# Patient Record
Sex: Female | Born: 1960 | Race: White | Hispanic: No | Marital: Married | Smoking: Former smoker
Health system: Southern US, Community
[De-identification: ages and names within clinical notes are randomized; demographics above are authoritative.]

## PROBLEM LIST (undated history)

## (undated) DIAGNOSIS — M8580 Other specified disorders of bone density and structure, unspecified site: Secondary | ICD-10-CM

## (undated) DIAGNOSIS — F419 Anxiety disorder, unspecified: Secondary | ICD-10-CM

## (undated) DIAGNOSIS — H539 Unspecified visual disturbance: Secondary | ICD-10-CM

## (undated) HISTORY — DX: Unspecified visual disturbance: H53.9

## (undated) HISTORY — DX: Anxiety disorder, unspecified: F41.9

## (undated) HISTORY — DX: Other specified disorders of bone density and structure, unspecified site: M85.80

## (undated) HISTORY — PX: DILATION AND CURETTAGE OF UTERUS: SHX78

---

## 1998-03-13 ENCOUNTER — Emergency Department (HOSPITAL_COMMUNITY): Admission: EM | Admit: 1998-03-13 | Discharge: 1998-03-13 | Payer: Self-pay | Admitting: Emergency Medicine

## 2003-03-17 ENCOUNTER — Other Ambulatory Visit: Admission: RE | Admit: 2003-03-17 | Discharge: 2003-03-17 | Payer: Self-pay | Admitting: Obstetrics and Gynecology

## 2004-11-20 ENCOUNTER — Other Ambulatory Visit: Admission: RE | Admit: 2004-11-20 | Discharge: 2004-11-20 | Payer: Self-pay | Admitting: Obstetrics and Gynecology

## 2005-12-17 ENCOUNTER — Other Ambulatory Visit: Admission: RE | Admit: 2005-12-17 | Discharge: 2005-12-17 | Payer: Self-pay | Admitting: Obstetrics and Gynecology

## 2009-02-14 ENCOUNTER — Ambulatory Visit (HOSPITAL_BASED_OUTPATIENT_CLINIC_OR_DEPARTMENT_OTHER): Admission: RE | Admit: 2009-02-14 | Discharge: 2009-02-14 | Payer: Self-pay | Admitting: Internal Medicine

## 2009-02-14 ENCOUNTER — Ambulatory Visit: Payer: Self-pay | Admitting: Diagnostic Radiology

## 2009-06-22 ENCOUNTER — Encounter: Admission: RE | Admit: 2009-06-22 | Discharge: 2009-06-22 | Payer: Self-pay | Admitting: Obstetrics and Gynecology

## 2009-08-09 ENCOUNTER — Ambulatory Visit (HOSPITAL_BASED_OUTPATIENT_CLINIC_OR_DEPARTMENT_OTHER): Admission: RE | Admit: 2009-08-09 | Discharge: 2009-08-09 | Payer: Self-pay | Admitting: Obstetrics and Gynecology

## 2009-08-09 ENCOUNTER — Ambulatory Visit: Payer: Self-pay | Admitting: Diagnostic Radiology

## 2010-08-16 ENCOUNTER — Ambulatory Visit: Payer: Self-pay | Admitting: Diagnostic Radiology

## 2010-08-16 ENCOUNTER — Ambulatory Visit (HOSPITAL_BASED_OUTPATIENT_CLINIC_OR_DEPARTMENT_OTHER): Admission: RE | Admit: 2010-08-16 | Discharge: 2010-08-16 | Payer: Self-pay | Admitting: Obstetrics and Gynecology

## 2011-07-11 ENCOUNTER — Other Ambulatory Visit: Payer: Self-pay | Admitting: Obstetrics and Gynecology

## 2011-07-11 DIAGNOSIS — Z1231 Encounter for screening mammogram for malignant neoplasm of breast: Secondary | ICD-10-CM

## 2011-07-12 ENCOUNTER — Other Ambulatory Visit: Payer: Self-pay | Admitting: Obstetrics and Gynecology

## 2011-07-12 DIAGNOSIS — M899 Disorder of bone, unspecified: Secondary | ICD-10-CM

## 2011-08-20 ENCOUNTER — Ambulatory Visit
Admission: RE | Admit: 2011-08-20 | Discharge: 2011-08-20 | Disposition: A | Discharge status: Home / Self Care | Payer: BC Managed Care – PPO | Source: Ambulatory Visit | Attending: Obstetrics and Gynecology | Admitting: Obstetrics and Gynecology

## 2011-08-20 DIAGNOSIS — M899 Disorder of bone, unspecified: Secondary | ICD-10-CM

## 2011-08-20 DIAGNOSIS — Z1231 Encounter for screening mammogram for malignant neoplasm of breast: Secondary | ICD-10-CM

## 2011-08-20 DIAGNOSIS — M949 Disorder of cartilage, unspecified: Secondary | ICD-10-CM

## 2012-07-14 ENCOUNTER — Other Ambulatory Visit: Payer: Self-pay | Admitting: Obstetrics and Gynecology

## 2012-07-14 DIAGNOSIS — Z1231 Encounter for screening mammogram for malignant neoplasm of breast: Secondary | ICD-10-CM

## 2012-08-20 ENCOUNTER — Ambulatory Visit
Admission: RE | Admit: 2012-08-20 | Discharge: 2012-08-20 | Disposition: A | Discharge status: Home / Self Care | Payer: BC Managed Care – PPO | Source: Ambulatory Visit | Attending: Obstetrics and Gynecology | Admitting: Obstetrics and Gynecology

## 2012-08-20 DIAGNOSIS — Z1231 Encounter for screening mammogram for malignant neoplasm of breast: Secondary | ICD-10-CM

## 2013-07-27 ENCOUNTER — Other Ambulatory Visit (HOSPITAL_BASED_OUTPATIENT_CLINIC_OR_DEPARTMENT_OTHER): Payer: Self-pay | Admitting: Obstetrics and Gynecology

## 2013-07-27 DIAGNOSIS — Z1231 Encounter for screening mammogram for malignant neoplasm of breast: Secondary | ICD-10-CM

## 2013-09-02 ENCOUNTER — Ambulatory Visit (HOSPITAL_BASED_OUTPATIENT_CLINIC_OR_DEPARTMENT_OTHER)
Admission: RE | Admit: 2013-09-02 | Discharge: 2013-09-02 | Disposition: A | Discharge status: Home / Self Care | Payer: BC Managed Care – PPO | Source: Ambulatory Visit | Attending: Obstetrics and Gynecology | Admitting: Obstetrics and Gynecology

## 2013-09-02 DIAGNOSIS — Z1231 Encounter for screening mammogram for malignant neoplasm of breast: Secondary | ICD-10-CM | POA: Insufficient documentation

## 2014-05-05 DIAGNOSIS — R319 Hematuria, unspecified: Secondary | ICD-10-CM

## 2014-05-05 DIAGNOSIS — N951 Menopausal and female climacteric states: Secondary | ICD-10-CM | POA: Insufficient documentation

## 2014-05-05 DIAGNOSIS — F419 Anxiety disorder, unspecified: Secondary | ICD-10-CM | POA: Insufficient documentation

## 2014-05-05 DIAGNOSIS — N029 Recurrent and persistent hematuria with unspecified morphologic changes: Secondary | ICD-10-CM | POA: Insufficient documentation

## 2014-06-28 ENCOUNTER — Emergency Department (HOSPITAL_COMMUNITY)
Admission: EM | Admit: 2014-06-28 | Discharge: 2014-06-28 | Disposition: A | Discharge status: Home / Self Care | Payer: BC Managed Care – PPO | Attending: Emergency Medicine | Admitting: Emergency Medicine

## 2014-06-28 ENCOUNTER — Encounter (HOSPITAL_COMMUNITY): Payer: Self-pay | Admitting: Emergency Medicine

## 2014-06-28 ENCOUNTER — Emergency Department (HOSPITAL_COMMUNITY): Payer: BC Managed Care – PPO

## 2014-06-28 DIAGNOSIS — R209 Unspecified disturbances of skin sensation: Secondary | ICD-10-CM

## 2014-06-28 DIAGNOSIS — Z87891 Personal history of nicotine dependence: Secondary | ICD-10-CM | POA: Insufficient documentation

## 2014-06-28 DIAGNOSIS — Z791 Long term (current) use of non-steroidal anti-inflammatories (NSAID): Secondary | ICD-10-CM | POA: Diagnosis not present

## 2014-06-28 DIAGNOSIS — R202 Paresthesia of skin: Secondary | ICD-10-CM

## 2014-06-28 DIAGNOSIS — Z79899 Other long term (current) drug therapy: Secondary | ICD-10-CM | POA: Diagnosis not present

## 2014-06-28 LAB — URINE MICROSCOPIC-ADD ON

## 2014-06-28 LAB — URINALYSIS, ROUTINE W REFLEX MICROSCOPIC
Bilirubin Urine: NEGATIVE
Glucose, UA: NEGATIVE mg/dL
Ketones, ur: NEGATIVE mg/dL
NITRITE: NEGATIVE
PROTEIN: NEGATIVE mg/dL
Specific Gravity, Urine: 1.008 (ref 1.005–1.030)
UROBILINOGEN UA: 0.2 mg/dL (ref 0.0–1.0)
pH: 7.5 (ref 5.0–8.0)

## 2014-06-28 LAB — BASIC METABOLIC PANEL
ANION GAP: 12 (ref 5–15)
BUN: 16 mg/dL (ref 6–23)
CHLORIDE: 102 meq/L (ref 96–112)
CO2: 28 meq/L (ref 19–32)
CREATININE: 0.73 mg/dL (ref 0.50–1.10)
Calcium: 9.5 mg/dL (ref 8.4–10.5)
GFR calc Af Amer: >90 mL/min (ref >90)
GFR calc non Af Amer: >90 mL/min (ref >90)
Glucose, Bld: 103 mg/dL — ABNORMAL HIGH (ref 70–99)
POTASSIUM: 4.2 meq/L (ref 3.7–5.3)
SODIUM: 142 meq/L (ref 137–147)

## 2014-06-28 LAB — DIFFERENTIAL
Basophils Absolute: 0 10*3/uL (ref 0.0–0.1)
Basophils Relative: 0 % (ref 0–1)
EOS PCT: 1 % (ref 0–5)
Eosinophils Absolute: 0.1 10*3/uL (ref 0.0–0.7)
LYMPHS ABS: 1.7 10*3/uL (ref 0.7–4.0)
Lymphocytes Relative: 21 % (ref 12–46)
MONOS PCT: 7 % (ref 3–12)
Monocytes Absolute: 0.6 10*3/uL (ref 0.1–1.0)
Neutro Abs: 5.5 10*3/uL (ref 1.7–7.7)
Neutrophils Relative %: 71 % (ref 43–77)

## 2014-06-28 LAB — RAPID URINE DRUG SCREEN, HOSP PERFORMED
Amphetamines: NOT DETECTED
BARBITURATES: NOT DETECTED
Benzodiazepines: NOT DETECTED
Cocaine: NOT DETECTED
OPIATES: NOT DETECTED
TETRAHYDROCANNABINOL: NOT DETECTED

## 2014-06-28 LAB — I-STAT CHEM 8, ED
BUN: 16 mg/dL (ref 6–23)
Calcium, Ion: 1.15 mmol/L (ref 1.12–1.23)
Chloride: 103 mEq/L (ref 96–112)
Creatinine, Ser: 0.8 mg/dL (ref 0.50–1.10)
Glucose, Bld: 105 mg/dL — ABNORMAL HIGH (ref 70–99)
HEMATOCRIT: 41 % (ref 36.0–46.0)
Hemoglobin: 13.9 g/dL (ref 12.0–15.0)
Potassium: 4 mEq/L (ref 3.7–5.3)
SODIUM: 141 meq/L (ref 137–147)
TCO2: 28 mmol/L (ref 0–100)

## 2014-06-28 LAB — CBC
HEMATOCRIT: 37.5 % (ref 36.0–46.0)
HEMOGLOBIN: 12.9 g/dL (ref 12.0–15.0)
MCH: 30.2 pg (ref 26.0–34.0)
MCHC: 34.4 g/dL (ref 30.0–36.0)
MCV: 87.8 fL (ref 78.0–100.0)
Platelets: 227 10*3/uL (ref 150–400)
RBC: 4.27 MIL/uL (ref 3.87–5.11)
RDW: 12.9 % (ref 11.5–15.5)
WBC: 8.1 10*3/uL (ref 4.0–10.5)

## 2014-06-28 LAB — I-STAT TROPONIN, ED: TROPONIN I, POC: 0 ng/mL (ref 0.00–0.08)

## 2014-06-28 LAB — ETHANOL

## 2014-06-28 LAB — PROTIME-INR
INR: 0.97 (ref 0.00–1.49)
Prothrombin Time: 12.9 seconds (ref 11.6–15.2)

## 2014-06-28 LAB — APTT: APTT: 28 s (ref 24–37)

## 2014-06-28 NOTE — ED Notes (Signed)
MD Bednar at bedside 

## 2014-06-28 NOTE — Code Documentation (Signed)
53yo female arriving to [facility] at 1515 via [facility].  Patient reports that she had sudden onset left facial, arm and upper back tingling at 1400.  Symptoms lasted for about 30 minutes then resolved.  EMS activated by coworkers.  Code stroke called in the ED.  Patient taken to CT.  NIHSS 0, see documentation for details and code stroke times.  Dr. Thad Ranger to the bedside.  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  She said she felt as if her BP was elevated and as if there were "burning nerves" in her back.  She reports she has been dealing with elevated BP for about a month as her BP is normally low.  She also endorses left foot tingling which is common for her r/t back issues.  Patient's symptoms have resolved, TIA alert.  No acute stroke treatment at this time. Bedside handoff with ED RN Toniann Fail.

## 2014-06-28 NOTE — Discharge Instructions (Signed)
You may have had a transient ischemic attack (TIA). This means that the nervous system did not work properly for a short time. It is caused by a low oxygen supply to an area of your brain. It may be caused by a small blood clot or hardening of the arteries. This is a temporary neurologic condition. It usually gets better within thirty minutes, but always within twenty-four hours. If this does not resolve within that time period, it is defined as a stroke. TIA's are warning signs that you are at risk for having a stroke. A small percentage of patients who have had a TIA will have a stroke within a couple days, and up to 20% will have a stroke within 3 months. SEEK IMMEDIATE MEDICAL ATTENTION (Call 911) IF: The original symptoms that brought you in are getting worse, or if you develop any new change in speech, vision, swallowing, or understanding, incoordination, weakness, numbness, tingling, dizziness, fainting, severe headache, chest pain, or other concerns. Some patients who are having a stroke are eligible to receive a medication which may improve their outcome, but the drug usually must be given within three hours from when symptoms first occur. So if you think you might be having stroke symptoms, don't wait, call 911.  You have neck pain, possibly from a cervical strain and/or pinched nerve.  SEEK IMMEDIATE MEDICAL ATTENTION IF: You develop difficulties swallowing or breathing.  You have new or worse numbness, weakness, tingling, or movement problems in your arms or legs.  You develop increasing pain which is uncontrolled with medications.  You have change in bowel or bladder function, or other concerns.  SEEK IMMEDIATE MEDICAL ATTENTION IF: New numbness, tingling, weakness, or problem with the use of your arms or legs.  Severe back pain not relieved with medications.  Change in bowel or bladder control.  Increasing pain in any areas of the body (such as chest or abdominal pain).  Shortness of  breath, dizziness or fainting.  Nausea (feeling sick to your stomach), vomiting, fever, or sweats.

## 2014-06-28 NOTE — ED Notes (Signed)
Pt to ct 

## 2014-06-28 NOTE — ED Provider Notes (Signed)
CSN: [id number]     Arrival date & time 06/28/14  1515 History   First MD Initiated Contact with Patient 06/28/14 1533     Chief Complaint  Patient presents with  . Hypertension     (Consider location/radiation/quality/duration/timing/severity/associated sxs/prior Treatment) HPI 53 year old female states of the last several weeks she has had constant pain in her neck as well as low back with occasional tingling to her entire left arm as well as entire left leg but not both at the same time in those episodes of tingling tend to last somewhere greater than 10-15 minutes and somewhere less than 2 hours but the patient cannot recall for sure, she denies pain down her arm or pain down her left leg she also denies numbness or weakness. She states she has been undergoing physical therapy from orthopedics for her back and her neck. She states she was last known well at work today at 2:00 this afternoon when she had recurrence tingling to the left side of her face and left arm which has been persistent in the left arm but the left face tingling is now resolved, she however now has some tingling to her left leg, she states this tingling sensation is without weakness or incoordination and without numbness. She states this tingling sensation to her left arm and leg is similar to what she has had previously. She does not recall for sure if she had left face tingling previously. She states this morning in the late morning around 11:00 she did have a slight headache that was almost imperceptible not sudden severe and without any other associated symptoms except for a sensation was racing and she was feeling anxious and her blood pressure was elevated to the 140s over 90s which is high for her. History reviewed. No pertinent past medical history. History reviewed. No pertinent past surgical history. Family History  Problem Relation Age of Onset  . Hypertension Mother    History  Substance Use Topics  . Smoking  status: Former Smoker    Quit date: 10/02/1983  . Smokeless tobacco: Not on file  . Alcohol Use: No   OB History   Grav Para Term Preterm Abortions TAB SAB Ect Mult Living                 Review of Systems 10 Systems reviewed and are negative for acute change except as noted in the HPI.   Allergies  Review of patient's allergies indicates no known allergies.  Home Medications   Prior to Admission medications   Medication Sig Start Date End Date Taking? Authorizing Provider  Ascorbic Acid (VITAMIN C PO) Take 1 tablet by mouth daily.    Yes Historical Provider, MD  CALCIUM PO Take 1 tablet by mouth daily.    Yes Historical Provider, MD  Cholecalciferol (VITAMIN D PO) Take 1 tablet by mouth daily.    Yes Historical Provider, MD  naproxen sodium (ANAPROX) 220 MG tablet Take 220 mg by mouth daily as needed (for pain).   Yes Historical Provider, MD  LORazepam (ATIVAN) 0.5 MG tablet Take 0.5 mg by mouth 2 (two) times daily as needed for anxiety.    Historical Provider, MD  tiZANidine (ZANAFLEX) 4 MG tablet Take 4 mg by mouth daily as needed for muscle spasms.    Historical Provider, MD   BP 154/77  Pulse 100  Temp(Src) 97.4 F (36.3 C)  Resp 15  Ht  (1.651 m)  Wt 118 lb (53.524 kg)  BMI 19.64  kg/m2  SpO2 100%  LMP 07/14/2011 Physical Exam  Nursing note and vitals reviewed. Constitutional:  Awake, alert, nontoxic appearance with baseline speech for patient.  HENT:  Head: Atraumatic.  Mouth/Throat: No oropharyngeal exudate.  Eyes: EOM are normal. Pupils are equal, round, and reactive to light. Right eye exhibits no discharge. Left eye exhibits no discharge.  Neck: Neck supple.  Cardiovascular: Normal rate and regular rhythm.   No murmur heard. Pulmonary/Chest: Effort normal and breath sounds normal. No stridor. No respiratory distress. She has no wheezes. She has no rales. She exhibits no tenderness.  Abdominal: Soft. Bowel sounds are normal. She exhibits no mass. There  is no tenderness. There is no rebound.  Musculoskeletal: She exhibits no tenderness.  Baseline ROM, moves extremities with no obvious new focal weakness.  Lymphadenopathy:    She has no cervical adenopathy.  Neurological: She is alert.  Awake, alert, cooperative and aware of situation; motor strength 5/5 bilaterally; sensation normal to light touch bilaterally; peripheral visual fields full to confrontation; no facial asymmetry; tongue midline; major cranial nerves appear intact; no pronator drift, normal finger to nose bilaterally  Skin: No rash noted.  Psychiatric: She has a normal mood and affect.    ED Course  Procedures (including critical care time) Patient / Family / Caregiver understand and agree with initial ED impression and plan with expectations set for ED visit. Initial history and physical performed ~1545 prior to code stroke being activated; patient's symptoms are confusing and difficult to differentiate initially if this is a stroke mimic versus stroke symptoms. Pt seen by Neuro and OK for discharge per Neuro. Patient / Family / Caregiver informed of clinical course, understand medical decision-making process, and agree with plan.   Labs Review Labs Reviewed  BASIC METABOLIC PANEL - Abnormal; Notable for the following:    Glucose, Bld 103 (*)    All other components within normal limits  URINALYSIS, ROUTINE W REFLEX MICROSCOPIC - Abnormal; Notable for the following:    Hgb urine dipstick TRACE (*)    Leukocytes, UA SMALL (*)    All other components within normal limits  I-STAT CHEM 8, ED - Abnormal; Notable for the following:    Glucose, Bld 105 (*)    All other components within normal limits  CBC  ETHANOL  PROTIME-INR  APTT  DIFFERENTIAL  URINE RAPID DRUG SCREEN (HOSP PERFORMED)  URINE MICROSCOPIC-ADD ON  CBG MONITORING, ED  I-STAT TROPOININ, ED    Imaging Review Ct Head Wo Contrast  06/28/2014   CLINICAL DATA:  Headache with hypertension  EXAM: CT HEAD  WITHOUT CONTRAST  TECHNIQUE: Contiguous axial images were obtained from the base of the skull through the vertex without intravenous contrast.  COMPARISON:  None.  FINDINGS: The ventricles are normal in size and configuration. There is no mass, hemorrhage, extra-axial fluid collection, or midline shift. Gray-white compartments appear normal. No acute infarct apparent. Bony calvarium appears intact. The visualized mastoid air cells are clear.  IMPRESSION: Study within normal limits. No demonstrable mass, hemorrhage, or acute appearing infarct.   Electronically Signed   By: Bretta Bang M.D.   On: 06/28/2014 16:02   Mr Brain Wo Contrast  06/28/2014   CLINICAL DATA:  53 year old female with transient episode of the left face and arm tingling. Initial encounter.  EXAM: MRI HEAD WITHOUT CONTRAST  TECHNIQUE: Multiplanar, multiecho pulse sequences of the brain and surrounding structures were obtained without intravenous contrast.  COMPARISON:  06/18/2014 head CT.  FINDINGS: No acute infarct.  No intracranial hemorrhage.  No intracranial mass lesion noted on this unenhanced exam.  No hydrocephalus  Scattered punctate nonspecific white matter type changes most notable frontal lobes. This may be related to result of small vessel disease or migraine headaches. The appearance is not typical for demyelinating process. Other causes of white matter type changes such as that secondary to vasculitis, inflammatory process or prior trauma not entirely excluded although less likely considerations.  Cerebellar tonsils minimally low lying but within the range of normal limits.  Major intracranial vascular structures are patent. Mildly ectatic basilar artery.  Pituitary region unremarkable. Partially calcified pineal gland incidentally noted.  Orbital structures unremarkable.  IMPRESSION: No acute infarct.  Mild nonspecific white matter changes as noted above.   Electronically Signed   By: Bridgett Larsson M.D.   On: 06/28/2014 19:10      EKG Interpretation   Date/Time:  Monday June 28 2014 15:33:33 EDT Ventricular Rate:  96 PR Interval:  140 QRS Duration: 96 QT Interval:  353 QTC Calculation: 446 R Axis:   85 Text Interpretation:  Sinus rhythm Probable left ventricular hypertrophy  No previous ECGs available Confirmed by [facility]  MD, Jonny Ruiz ([id number]) on  06/28/2014 5:34:48 PM      MDM   Final diagnoses:  Paresthesias    I doubt any other EMC precluding discharge at this time including, but not necessarily limited to the following:SAH, HTN crisis, CVA.    Hurman Horn, MD 06/30/14 [phone]

## 2014-06-28 NOTE — ED Notes (Signed)
Pt in from work, per report pt c/o elevated BP onset this am, pt reports being at work & c/o tingling in L face, L neck & L arm today 14:00, pt denies slurred speech, A&O x 4, follows commands, speaks in complete, denies taking BP, denies CP, SOB, n/v/d

## 2014-06-28 NOTE — Consult Note (Signed)
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  She was with a patient today when she noticed a new onset of left face and arm tingling.  Coworkers took her BP and she states it "was elevated at work". This lasted less than one hour.  Due to fear this may be a CVA she was brought to [facility] ED. Currently she has no left arm or face tingling simply feel anxious.  Reports the numbness was on the left cheek and in the entire left upper extremity.  In the leg the numbness was mostly in the foot.  Patient also adds that numbness was in the posterior portion of her shoulder but not the anterior portion.    Date last known well: Date: 06/28/2014 Time last known well: Time: 14:00 tPA Given: No: symptoms resolved  No pertinent past medical history.  No pertinent past surgical history.  Family History  Problem Relation Age of Onset  . Hypertension Mother    Social History:  reports that she quit smoking about 30 years ago. She does not have any smokeless tobacco history on file. She reports that she does not drink alcohol or use illicit drugs.  Allergies: No Known Allergies  Medications:                                                                                                                           NONE  ROS:  History obtained from the patient  General ROS: negative for - chills, fatigue, fever, night sweats, weight gain or weight loss Psychological ROS: negative for - behavioral disorder, hallucinations, memory difficulties, mood swings or suicidal ideation Ophthalmic ROS: negative for - blurry vision, double vision, eye pain or loss of  vision ENT ROS: negative for - epistaxis, nasal discharge, oral lesions, sore throat, tinnitus or vertigo Allergy and Immunology ROS: negative for - hives or itchy/watery eyes Hematological and Lymphatic ROS: negative for - bleeding problems, bruising or swollen lymph nodes Endocrine ROS: negative for - galactorrhea, hair pattern changes, polydipsia/polyuria or temperature intolerance Respiratory ROS: negative for - cough, hemoptysis, shortness of breath or wheezing Cardiovascular ROS: negative for - chest pain, dyspnea on exertion, edema or irregular heartbeat Gastrointestinal ROS: negative for - abdominal pain, diarrhea, hematemesis, nausea/vomiting or stool incontinence Genito-Urinary ROS: negative for - dysuria, hematuria, incontinence or urinary frequency/urgency Musculoskeletal ROS: negative for - joint swelling or muscular weakness Neurological ROS: as noted in HPI Dermatological ROS: negative for rash and skin lesion changes  Neurologic Examination:                                                                                                      Blood pressure 165/80, pulse 104, resp. rate 20, last menstrual period 07/14/2011, SpO2 100.00%.   General: Anxious Mental Status: Alert, oriented, thought content appropriate.  Speech fluent without evidence of aphasia.  Able to follow 3 step commands without difficulty. Cranial Nerves: II: Discs flat bilaterally; Visual fields grossly normal, pupils equal, round, reactive to light and accommodation III,IV, VI: ptosis not present, extra-ocular motions intact bilaterally V,VII: smile symmetric, facial light touch sensation normal bilaterally VIII: hearing normal bilaterally IX,X: gag reflex present XI: bilateral shoulder shrug XII: midline tongue extension without atrophy or fasciculations  Motor: Right : Upper extremity   5/5    Left:     Upper extremity   5/5  Lower extremity   5/5     Lower extremity   5/5 Tone and bulk:normal  tone throughout; no atrophy noted Sensory: Pinprick and light touch intact throughout, bilaterally Deep Tendon Reflexes:  Right: Upper Extremity   Left: Upper extremity   biceps (C-5 to C-6) 2/4   biceps (C-5 to C-6) 2/4 tricep (C7) 2/4    triceps (C7) 2/4 Brachioradialis (C6) 2/4  Brachioradialis (C6) 2/4  Lower Extremity Lower Extremity  quadriceps (L-2 to L-4) 2/4   quadriceps (L-2 to L-4) 2/4 Achilles (S1) 2/4   Achilles (S1) 2/4  Plantars: Right: downgoing   Left: downgoing Cerebellar: normal finger-to-nose,  normal heel-to-shin test Gait: not tested CV: pulses palpable throughout    Lab Results: Basic Metabolic Panel:  Recent Labs Lab 06/28/14 1618  NA 141  K 4.0  CL 103  GLUCOSE 105*  BUN 16  CREATININE 0.80    Liver Function Tests: No results found for this basename: AST, ALT, ALKPHOS, BILITOT, PROT, ALBUMIN,  in the last 168 hours No results found for this basename: LIPASE, AMYLASE,  in the last 168 hours No results found for this  basename: AMMONIA,  in the last 168 hours  CBC:  Recent Labs Lab 06/28/14 1607 06/28/14 1618  WBC 8.1  --   HGB 12.9 13.9  HCT 37.5 41.0  MCV 87.8  --   PLT 227  --     Cardiac Enzymes: No results found for this basename: CKTOTAL, CKMB, CKMBINDEX, TROPONINI,  in the last 168 hours  Lipid Panel: No results found for this basename: CHOL, TRIG, HDL, CHOLHDL, VLDL, LDLCALC,  in the last 168 hours  CBG: No results found for this basename: GLUCAP,  in the last 168 hours  Microbiology: No results found for this or any previous visit.  Coagulation Studies: No results found for this basename: LABPROT, INR,  in the last 72 hours  Imaging: Ct Head Wo Contrast  06/28/2014   CLINICAL DATA:  Headache with hypertension  EXAM: CT HEAD WITHOUT CONTRAST  TECHNIQUE: Contiguous axial images were obtained from the base of the skull through the vertex without intravenous contrast.  COMPARISON:  None.  FINDINGS: The ventricles are  normal in size and configuration. There is no mass, hemorrhage, extra-axial fluid collection, or midline shift. Gray-white compartments appear normal. No acute infarct apparent. Bony calvarium appears intact. The visualized mastoid air cells are clear.  IMPRESSION: Study within normal limits. No demonstrable mass, hemorrhage, or acute appearing infarct.   Electronically Signed   By: Bretta Bang M.D.   On: 06/28/2014 16:02    Felicie Morn PA-C [facility] [phone]  06/28/2014, 4:23 PM  Patient seen and examined.  Clinical course and management discussed.  Necessary edits performed.  I agree with the above.  Assessment and plan of care developed and discussed below.    Assessment: 53 y.o. female with transient left arm and face tingling.  Patient also with complaints of neck and back pain.  Seeing an orthopedist and recieving outpatient PT.   Currently back to baseline. Patient has no stroke risk factors and NIHSS 0. Head CT reviewed and shows no acute changes.    Stroke Risk Factors - none  Recommendations: 1.  MRI of the brain without contrast.  Would not initiate a stroke work up unless clinical changes noted or imaging diagnostic of an acute ischemic event.   2.  ASA  daily 3.  Patient to follow up with outpatient orthopedist and outpatient PCP  Case discussed with Dr. Jennet Maduro, MD [facility] [phone]  06/28/2014  5:27 PM

## 2014-06-28 NOTE — ED Notes (Signed)
Pt not in room for Neuro check; pt in MRI

## 2014-06-30 DIAGNOSIS — M542 Cervicalgia: Secondary | ICD-10-CM | POA: Insufficient documentation

## 2014-06-30 DIAGNOSIS — I10 Essential (primary) hypertension: Secondary | ICD-10-CM | POA: Insufficient documentation

## 2014-07-05 DIAGNOSIS — F4001 Agoraphobia with panic disorder: Secondary | ICD-10-CM | POA: Insufficient documentation

## 2014-10-12 ENCOUNTER — Other Ambulatory Visit (HOSPITAL_BASED_OUTPATIENT_CLINIC_OR_DEPARTMENT_OTHER): Payer: Self-pay | Admitting: Obstetrics and Gynecology

## 2014-10-12 DIAGNOSIS — Z1231 Encounter for screening mammogram for malignant neoplasm of breast: Secondary | ICD-10-CM

## 2014-10-26 ENCOUNTER — Ambulatory Visit (HOSPITAL_BASED_OUTPATIENT_CLINIC_OR_DEPARTMENT_OTHER)
Admission: RE | Admit: 2014-10-26 | Discharge: 2014-10-26 | Disposition: A | Discharge status: Home / Self Care | Payer: BLUE CROSS/BLUE SHIELD | Source: Ambulatory Visit | Attending: Obstetrics and Gynecology | Admitting: Obstetrics and Gynecology

## 2014-10-26 DIAGNOSIS — Z1231 Encounter for screening mammogram for malignant neoplasm of breast: Secondary | ICD-10-CM | POA: Insufficient documentation

## 2014-10-27 ENCOUNTER — Other Ambulatory Visit: Payer: Self-pay | Admitting: Obstetrics and Gynecology

## 2015-02-04 ENCOUNTER — Other Ambulatory Visit (HOSPITAL_COMMUNITY): Payer: Self-pay | Admitting: Obstetrics and Gynecology

## 2015-02-07 LAB — CYTOLOGY - PAP

## 2015-02-09 ENCOUNTER — Encounter: Payer: Self-pay | Admitting: Neurology

## 2015-02-09 ENCOUNTER — Ambulatory Visit (INDEPENDENT_AMBULATORY_CARE_PROVIDER_SITE_OTHER): Payer: BLUE CROSS/BLUE SHIELD | Admitting: Neurology

## 2015-02-09 VITALS — BP 146/90 | HR 82 | Resp 16 | Ht 65.0 in | Wt 119.0 lb

## 2015-02-09 DIAGNOSIS — G5762 Lesion of plantar nerve, left lower limb: Secondary | ICD-10-CM | POA: Diagnosis not present

## 2015-02-09 DIAGNOSIS — M5432 Sciatica, left side: Secondary | ICD-10-CM

## 2015-02-09 DIAGNOSIS — R209 Unspecified disturbances of skin sensation: Secondary | ICD-10-CM | POA: Diagnosis not present

## 2015-02-09 DIAGNOSIS — G5760 Lesion of plantar nerve, unspecified lower limb: Secondary | ICD-10-CM | POA: Insufficient documentation

## 2015-02-09 DIAGNOSIS — F419 Anxiety disorder, unspecified: Secondary | ICD-10-CM | POA: Diagnosis not present

## 2015-02-09 NOTE — Patient Instructions (Signed)
I advised her to try alpha lipoic acid 200 mg 2 or 3 times a day. If pain worsens, I would consider adding gabapentin or lamotrigine or a tricyclic If mood issues worsen, consider Cymbalta as it may also help her nerve pain.

## 2015-02-09 NOTE — Progress Notes (Signed)
[facility]  PATIENT: Mariah [name] DOB: [birth year]  REFERRING DOCTOR OR PCP:  Idamae Schuller (PCP) and Suella Broad  (Ortho/pain) SOURCE: patient and records from EMR, images on CD  _________________________________   HISTORICAL  CHIEF COMPLAINT:  Chief Complaint  Patient presents with  . Numbness    Sts. onset around October 2015 of numbness 1st and 2nd toes of her left foot, left heel.  She notices numbness more when she is wearing flat shoes.  Sts. she also has a hx. of numbness in her left arm, but that she had a "mental breakdown" and that when she was rx'd Lorazepam and Zoloft, that numbnes in arm resolved, but numbness in left foot remained.  Sts. mri of neck and back were normal, and Dr. Nelva Bush was not able to give a clear dx./fim    HISTORY OF PRESENT ILLNESS:  I had the pleasure of seeing your patient, Mariah [name], at [facility] for neurologic consultation regarding her left foot numbness.    She first noted numbness in the left foot around October 2015. The distribution of the numbness is mostly in the insole and involving the first and second toe but not further laterally. Numbness is only in the bottom and side of the toes and foot and not on the dorsum.    Initially, she also had neck and back pain and milder paresthesias in her left arm and rest of leg (intermittently).   MRI images were personally reviewed. MRI's of the lumbar spine 07/26/14 shows left facet hypertrophy but no nerve root compression.   MRI of the cervical spine just shows small bulges at C5-C6 and C6-C7 that did not lead to any nerve root or spinal cord impingement.   Shortly after those studies, she had a lot more anxiety and was placed on Zoloft.    The left arm and proximal leg paresthesia all improved but the foot numbness persisted.     She never had any weakness in any of the limbs nor any change in her bladder or change in her gait.  In February, she  simplified her medications by stopping the Zoloft and also stopping the hormone replacement. Currently, she is only on when necessary Naprosyn and takes only a couple a week, usually when she notes she is going to be doing more activity such as playing tennis.  She also reports left buttock pain at times. This is located just below the sacral and will sometimes radiate a few inches down, but not all the way to the foot. Naprosyn helps this pain some.  REVIEW OF SYSTEMS: Constitutional: No fevers, chills, sweats, or change in appetite Eyes: No visual changes, double vision, eye pain Ear, nose and throat: No hearing loss, ear pain, nasal congestion, sore throat Cardiovascular: No chest pain, palpitations Respiratory: No shortness of breath at rest or with exertion.   No wheezes GastrointestinaI: No nausea, vomiting, diarrhea, abdominal pain, fecal incontinence Genitourinary: No dysuria, urinary retention or frequency.  No nocturia. Musculoskeletal: No neck pain, back pain Integumentary: No rash, pruritus, skin lesions Neurological: as above Psychiatric: No depression at this time.  She has anxiety Endocrine: No palpitations, diaphoresis, change in appetite, change in weigh or increased thirst Hematologic/Lymphatic: No anemia, purpura, petechiae. Allergic/Immunologic: No itchy/runny eyes, nasal congestion, recent allergic reactions, rashes  ALLERGIES: No Known Allergies  HOME MEDICATIONS:  Current outpatient prescriptions:  .  naproxen sodium (ANAPROX) 220 MG tablet, Take 220 mg by mouth 2 (two) times daily with a meal.,  Disp: , Rfl:   PAST MEDICAL HISTORY: Past Medical History  Diagnosis Date  . Vision abnormalities     PAST SURGICAL HISTORY: History reviewed. No pertinent past surgical history.  FAMILY HISTORY: Family History  Problem Relation Age of Onset  . Hypertension Mother   . Melanoma Father   . Multiple myeloma Father     SOCIAL HISTORY:  History   Social  History  . Marital Status: Married    Spouse Name: N/A  . Number of Children: N/A  . Years of Education: N/A   Occupational History  . Not on file.   Social History Main Topics  . Smoking status: Former Smoker    Quit date: 10/02/1983  . Smokeless tobacco: Not on file  . Alcohol Use: No  . Drug Use: No  . Sexual Activity: Not on file   Other Topics Concern  . Not on file   Social History Narrative     PHYSICAL EXAM  Filed Vitals:   02/09/15 1359  BP: 146/90  Pulse: 82  Resp: 16  Height: '5\' 5"'  (1.651 m)  Weight: 119 lb (53.978 kg)    Body mass index is 19.8 kg/(m^2).   General: The patient is well-developed and well-nourished and in no acute distress  Neck: The neck is supple, no carotid bruits are noted.  The neck is nontender.  Cardiovascular: The heart has a regular rate and rhythm with a normal S1 and S2. There were no murmurs, gallops or rubs.   Skin: Extremities are without significant edema or rash.   Joints are not inflamed.   Musculoskeletal:  Back is minimally tender over the left piriformis muscle.  Neurologic Exam  Mental status: The patient is alert and oriented x 3 at the time of the examination. The patient has apparent normal recent and remote memory, with an apparently normal attention span and concentration ability.   Speech is normal.  Cranial nerves: Extraocular movements are full.   Facial symmetry is present. There is good facial sensation to soft touch bilaterally.Facial strength is normal.  Trapezius and sternocleidomastoid strength is normal. No dysarthria is noted.  The tongue is midline, and the patient has symmetric elevation of the soft palate. No obvious hearing deficits are noted.  Motor:  Muscle bulk is normal.   Tone is normal. Strength is  5 / 5 in all 4 extremities.   Sensory: Sensory testing is intact to pinprick, soft touch and vibration sensation in all 4 extremities.  Coordination: Cerebellar testing reveals good  finger-nose-finger and heel-to-shin bilaterally.  Gait and station: Station is normal.   Gait is normal. Tandem gait is normal. Romberg is negative.   Reflexes: Deep tendon reflexes are symmetric and normal bilaterally.   Plantar responses are flexor.    DIAGNOSTIC DATA (LABS, IMAGING, TESTING) - I reviewed patient records, labs, notes, testing and imaging myself where available.  Lab Results  Component Value Date   WBC 8.1 06/28/2014   HGB 13.9 06/28/2014   HCT 41.0 06/28/2014   MCV 87.8 06/28/2014   PLT 227 06/28/2014      Component Value Date/Time   NA 141 06/28/2014 1618   K 4.0 06/28/2014 1618   CL 103 06/28/2014 1618   CO2 28 06/28/2014 1607   GLUCOSE 105* 06/28/2014 1618   BUN 16 06/28/2014 1618   CREATININE 0.80 06/28/2014 1618   CALCIUM 9.5 06/28/2014 1607   GFRNONAA >90 06/28/2014 1607   GFRAA >90 06/28/2014 1607       ASSESSMENT  AND PLAN  Disturbance of skin sensation - Plan: Sedimentation rate, ANA w/Reflex  Anxiety  Medial plantar neuropathy, left - Plan: Sedimentation rate, ANA w/Reflex  Sciatica of left side without back pain   In summary, Kaia Depaolis is a 54 year old woman with a sensation of numbness and painful tingling in the left foot in what appears to be mostly the medial plantar nerve distribution in all fleeting tingling that she gets appears to be more related to her anxiety than to an actual neuropathic process. She also appears to have some tenderness in the left piriformis muscle that might be causing a mild sciatica at times.    I will check an ESR and ANA to check for abnormalities that could be seen with vasculitis, but I think that the medial plantar neuropathy is probably idiopathic.    We discussed that I could start a medicine to help her tingling sensations but we agreed that the current symptoms do not appear to be bad enough to risk any side effects. Therefore she will hold off at this time. If the tingling pain worsened  significantly, I would consider gabapentin, lamotrigine or a tricyclic. Current anxiety seems to be doing well. However, if anxiety and/or depression worsens, Cymbalta might help her mood issues as well as the tingling sensations. Her piriformis/sciatica type pain is minimal today. If it stays intermittent, when necessary Naprosyn is probably the most appropriate. I did also instruct her on piriformis stretch exercises. If this worsens, she can call and I would consider an injection and physical therapy.  She will return as needed if she has new or worsening neurologic symptoms.   Richard A. Felecia Shelling, MD, PhD 2/39/3594, 0:90 PM Certified in Neurology, Clinical Neurophysiology, Sleep Medicine, Pain Medicine and Neuroimaging  [facility] [address], [place], [place] [address] [phone]

## 2015-02-10 LAB — ANA W/REFLEX: Anti Nuclear Antibody(ANA): NEGATIVE

## 2015-02-10 LAB — SEDIMENTATION RATE: Sed Rate: 2 mm/hr (ref 0–40)

## 2015-02-15 ENCOUNTER — Telehealth: Payer: Self-pay

## 2015-02-15 ENCOUNTER — Telehealth: Payer: Self-pay | Admitting: *Deleted

## 2015-02-15 NOTE — Telephone Encounter (Signed)
VM left for patient to call office back to receive normal lab results 

## 2015-02-15 NOTE — Telephone Encounter (Signed)
I have spoken with Marylu LundJanet this afternoon and advised that, per RAS, labs were normal.  She verbalized understanding of same, sts. I am the 2nd nurse to call her with normal lab results/fim

## 2015-02-15 NOTE — Telephone Encounter (Signed)
Patient called/returning Joy's call regarding her results. Please call and advise. Patient can be reached @ [phone]

## 2015-02-15 NOTE — Telephone Encounter (Signed)
-----   Message from Asa Lenteichard A Sater, MD sent at 02/14/2015  6:46 PM EDT ----- Please let her know labwork was normal

## 2015-02-15 NOTE — Telephone Encounter (Signed)
Returned pt call regarding normal lab results, she has no questions at this time

## 2015-10-11 ENCOUNTER — Other Ambulatory Visit (HOSPITAL_BASED_OUTPATIENT_CLINIC_OR_DEPARTMENT_OTHER): Payer: Self-pay | Admitting: Obstetrics and Gynecology

## 2015-10-11 DIAGNOSIS — Z1231 Encounter for screening mammogram for malignant neoplasm of breast: Secondary | ICD-10-CM

## 2015-11-14 ENCOUNTER — Ambulatory Visit (HOSPITAL_BASED_OUTPATIENT_CLINIC_OR_DEPARTMENT_OTHER)
Admission: RE | Admit: 2015-11-14 | Discharge: 2015-11-14 | Disposition: A | Discharge status: Home / Self Care | Payer: BLUE CROSS/BLUE SHIELD | Source: Ambulatory Visit | Attending: Obstetrics and Gynecology | Admitting: Obstetrics and Gynecology

## 2015-11-14 ENCOUNTER — Ambulatory Visit (HOSPITAL_BASED_OUTPATIENT_CLINIC_OR_DEPARTMENT_OTHER): Payer: BLUE CROSS/BLUE SHIELD

## 2015-11-14 DIAGNOSIS — Z1231 Encounter for screening mammogram for malignant neoplasm of breast: Secondary | ICD-10-CM | POA: Diagnosis present

## 2016-11-28 ENCOUNTER — Other Ambulatory Visit (HOSPITAL_BASED_OUTPATIENT_CLINIC_OR_DEPARTMENT_OTHER): Payer: Self-pay | Admitting: Obstetrics and Gynecology

## 2016-11-28 DIAGNOSIS — Z1231 Encounter for screening mammogram for malignant neoplasm of breast: Secondary | ICD-10-CM

## 2016-12-10 ENCOUNTER — Encounter (HOSPITAL_BASED_OUTPATIENT_CLINIC_OR_DEPARTMENT_OTHER): Payer: Self-pay

## 2016-12-10 ENCOUNTER — Ambulatory Visit (HOSPITAL_BASED_OUTPATIENT_CLINIC_OR_DEPARTMENT_OTHER)
Admission: RE | Admit: 2016-12-10 | Discharge: 2016-12-10 | Disposition: A | Discharge status: Home / Self Care | Payer: BLUE CROSS/BLUE SHIELD | Source: Ambulatory Visit | Attending: Obstetrics and Gynecology | Admitting: Obstetrics and Gynecology

## 2016-12-10 DIAGNOSIS — Z1231 Encounter for screening mammogram for malignant neoplasm of breast: Secondary | ICD-10-CM | POA: Insufficient documentation

## 2017-11-06 ENCOUNTER — Other Ambulatory Visit (HOSPITAL_BASED_OUTPATIENT_CLINIC_OR_DEPARTMENT_OTHER): Payer: Self-pay | Admitting: Obstetrics and Gynecology

## 2017-11-06 DIAGNOSIS — Z1231 Encounter for screening mammogram for malignant neoplasm of breast: Secondary | ICD-10-CM

## 2017-12-16 ENCOUNTER — Ambulatory Visit (HOSPITAL_BASED_OUTPATIENT_CLINIC_OR_DEPARTMENT_OTHER)
Admission: RE | Admit: 2017-12-16 | Discharge: 2017-12-16 | Disposition: A | Discharge status: Home / Self Care | Payer: BLUE CROSS/BLUE SHIELD | Source: Ambulatory Visit | Attending: Obstetrics and Gynecology | Admitting: Obstetrics and Gynecology

## 2017-12-16 DIAGNOSIS — Z1231 Encounter for screening mammogram for malignant neoplasm of breast: Secondary | ICD-10-CM | POA: Insufficient documentation

## 2018-07-14 ENCOUNTER — Ambulatory Visit: Payer: BLUE CROSS/BLUE SHIELD | Admitting: Psychiatry

## 2018-07-14 DIAGNOSIS — F411 Generalized anxiety disorder: Secondary | ICD-10-CM | POA: Diagnosis not present

## 2018-07-14 NOTE — Progress Notes (Signed)
      [facility] Counselor/Therapist Progress Note   Patient ID: Mariah [name], MRN: [id number]  Date: 07/14/2018  Timespent: 50 minutes  Treatment Type: Individual  Subjective: Patient was present for session.  She reported she was not feeling well during session.  She explained she had just gotten back a few days ago from her trip to [place].  Patient stated that overall intake down well, she was able to let go of the things, now back home while she was everything there which helped her rest better and be more relaxed.  Patient shared prior to leaving she had to deal with her mother-in-law being in the hospital due to low sodium.  Patient stated that the mother-in-law takes too many laxatives and that is probably why her sodium gets low.  She went on to share that since returning her sleep has been off and she has been back to the stress of having to help her daughter deal with stressors in her life.  Patient shared she is ready to deal with the issue between her daughter and son because it is time to get that had all of them.  Patient did treatment plan in session and agreed that her biggest concern now is to keep being able to function with a variety of different life stressors.  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  Patient was encouraged to get back to doing the things that were helping her prior to leaving including her positive affirmations as well as her spiritual affirmations.  Patient was reminded to CBT skills that help and the importance of reminding herself she can only deal with things that she can control things and change.  Interventions:CBT, Solution Focused and Supportive  Mental Status Exam:   Appearance:   Neat     Behavior:  Appropriate  Motor:  Normal  Speech/Language:   Normal Rate  Affect:  Appropriate  Mood:  normal  Thought  process:  Intact  Thought content:    Logical  Perceptual disturbances:    Normal  Orientation:  Full (Time, Place, and Person)  Attention:  Good  Concentration:  good  Memory:  Immediate  Fund of knowledge:   Good  Insight:    Good  Judgment:   Good  Impulse Control:  good    Reported Symptoms: anxiety,fatigue, sleep issues  Risk Assessment: Danger to Self:  No Self-injurious Behavior: No Danger to Others: No Duty to Warn:no Physical Aggression / Violence:No  Access to Firearms a concern: No  Gang Involvement:No   Diagnosis:   ICD-10-CM   1. Generalized anxiety disorder F41.1      Plan: 1.  Patient to continue to engage in individual counseling 2-4 times a month or as needed. 2.  Patient to identify and apply CBT, coping skills learned in session to decrease anxiety symptoms. 3.  Patient to contact this office, go to the local ED or call 911 if a crisis or emergency develops between visits.  [name], [place]

## 2018-07-28 ENCOUNTER — Ambulatory Visit: Payer: BLUE CROSS/BLUE SHIELD | Admitting: Psychiatry

## 2018-07-28 DIAGNOSIS — F411 Generalized anxiety disorder: Secondary | ICD-10-CM

## 2018-07-28 NOTE — Progress Notes (Signed)
      [facility] Counselor/Therapist Progress Note   Patient ID: Mariah [name], MRN: [id number]  Date: 07/28/2018  Timespent: 49 minutes   Treatment Type: Individual   Reported Symptoms: Sleep disturbance, Physical aches and pain and anxiety   Mental Status Exam:    Appearance:   Neat     Behavior:  Appropriate  Motor:  Normal  Speech/Language:   Normal Rate  Affect:  Appropriate  Mood:  anxious  Thought process:  normal  Thought content:    WNL  Sensory/Perceptual disturbances:    WNL  Orientation:  oriented to person, place and time/date  Attention:  Good  Concentration:  Good  Memory:  WNL  Fund of knowledge:   Good  Insight:    Good  Judgment:   Good  Impulse Control:  Good     Risk Assessment: Danger to Self:  No Self-injurious Behavior: No Danger to Others: No Duty to Warn:no Physical Aggression / Violence:No  Access to Firearms a concern: No  Gang Involvement:No    Subjective: Patient was present for session.  Patient reported she wanted to be very focused on the meeting she had to have with her son and daughter over the Thanksgiving.  Patient and clinician discussed what she wanted to have occurred during the meeting and different ways that she can communicate with her kids to help allow them both to communicate and resolve some issues from the past.  Patient explained she is working hard to try and forgive her husband for modeling the lack of communication and ways to disconnect from situations for her son.  Patient explained she is trying to recognize that he had the same things model for him and that she just has to keep encouraging him to make different choices.  Patient also discussed her small group and how what she is learning in group may be helpful but she does not feel comfortable sharing what is going on with her currently.  Discussed how she wanted to handle the situation.  Patient decided at this point it would be best that she went to one more  meeting and then pulled out of the group at this time.  Patient was encouraged to do what she felt was best for her in the situation.   Interventions: Solution-Oriented/Positive Psychology   Diagnosis:   ICD-10-CM   1. Generalized anxiety disorder F41.1      Plan: 1.  Patient to continue to engage in individual counseling 2-4 times a month or as needed. 2.  Patient to identify and apply coping skills learned in session to decrease anxiety symptoms. 3.  Patient to contact this office, go to the local ED or call 911 if a crisis or emergency develops between visits.   [name], [place]

## 2018-08-19 ENCOUNTER — Encounter: Payer: Self-pay | Admitting: Emergency Medicine

## 2018-08-19 ENCOUNTER — Ambulatory Visit: Payer: BLUE CROSS/BLUE SHIELD | Admitting: Psychiatry

## 2018-08-19 DIAGNOSIS — G4700 Insomnia, unspecified: Secondary | ICD-10-CM | POA: Insufficient documentation

## 2018-08-19 DIAGNOSIS — F411 Generalized anxiety disorder: Secondary | ICD-10-CM | POA: Diagnosis not present

## 2018-08-19 DIAGNOSIS — F5104 Psychophysiologic insomnia: Secondary | ICD-10-CM | POA: Insufficient documentation
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
## 2018-09-03 ENCOUNTER — Encounter: Payer: Self-pay | Admitting: Psychiatry

## 2018-09-03 ENCOUNTER — Ambulatory Visit: Payer: BLUE CROSS/BLUE SHIELD | Admitting: Psychiatry

## 2018-09-03 DIAGNOSIS — F411 Generalized anxiety disorder: Secondary | ICD-10-CM
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
## 2018-09-15 ENCOUNTER — Ambulatory Visit: Payer: BLUE CROSS/BLUE SHIELD | Admitting: Psychiatry

## 2018-09-15 ENCOUNTER — Encounter: Payer: Self-pay | Admitting: Psychiatry

## 2018-09-15 DIAGNOSIS — F411 Generalized anxiety disorder: Secondary | ICD-10-CM

## 2018-09-15 NOTE — Progress Notes (Signed)
      [facility] Counselor/Therapist Progress Note  Patient ID: Mariah DoeJanet Barkalow, MRN: [id number],    Date: 09/15/2018  Time Spent: 48 minutes  Treatment Type: Individual Therapy,sleep issues  Reported Symptoms: Anxious Mood  Mental Status Exam:  Appearance:   Well Groomed     Behavior:  Appropriate  Motor:  Normal  Speech/Language:   Normal Rate  Affect:  Congruent  Mood:  anxious  Thought process:  normal  Thought content:    WNL  Sensory/Perceptual disturbances:    WNL  Orientation:  oriented to person, place and time/date  Attention:  Good  Concentration:  Good  Memory:  WNL  Fund of knowledge:   Good  Insight:    Good  Judgment:   Good  Impulse Control:  Good   Risk Assessment: Danger to Self:  No Self-injurious Behavior: No Danger to Others: No Duty to Warn:no Physical Aggression / Violence:No  Access to Firearms a concern: No  Gang Involvement:No   Subjective: Patient was present for session.  Patient reported her conference went very well and she was able to have some very positive insights.  She also shared there have been positive results from the family meeting and she is very excited that there seems to be some positive movement with her children.  Patient went on to explain she is ready to talk to her children about some of her past and she wanted to process through how much to share and how much to just retain for herself.  Patient was able to develop a plan that she felt was positive and different alternatives to the plan as needed.  Patient went on to explain her hardest issue of currently is her husband and how to interact with him differently.  Patient was able to identify that she cannot control or change him.  She was encouraged to remind herself to focus on his positives and things that attracted her to him rather than on his faults.  Patient reported she is going to start writing them down and reminding herself of them regularly.  Interventions:  Solution-Oriented/Positive Psychology  Diagnosis:   ICD-10-CM   1. Generalized anxiety disorder F41.1     Plan: 1.  Patient to continue to engage in individual counseling 2-4 times a month or as needed. 2.  Patient to identify and apply coping skills learned in session to decrease anxiety symptoms. 3.  Patient to contact this office, go to the local ED or call 911 if a crisis or emergency develops between visits.  Stevphen MeuseHolly Anitha Kreiser, [place]

## 2018-10-06 ENCOUNTER — Ambulatory Visit: Payer: BLUE CROSS/BLUE SHIELD | Admitting: Psychiatry

## 2018-10-06 ENCOUNTER — Encounter: Payer: Self-pay | Admitting: Psychiatry

## 2018-10-06 DIAGNOSIS — F411 Generalized anxiety disorder: Secondary | ICD-10-CM | POA: Diagnosis not present

## 2018-10-06 NOTE — Progress Notes (Signed)
      [facility] Counselor/Therapist Progress Note  Patient ID: Mariah [name], MRN: [id number],    Date: 10/06/2018  Time Spent: 48 minutes  Treatment Type: Individual Therapy  Reported Symptoms: Anxious Mood, Sleep disturbance and Fatigue  Mental Status Exam:  Appearance:   Well Groomed     Behavior:  Sharing  Motor:  Normal  Speech/Language:   Normal Rate  Affect:  Congruent  Mood:  normal  Thought process:  circumstantial  Thought content:    WNL  Sensory/Perceptual disturbances:    WNL  Orientation:  oriented to person, place and time/date  Attention:  Good  Concentration:  Good  Memory:  WNL  Fund of knowledge:   Good  Insight:    Good  Judgment:   Good  Impulse Control:  Good   Risk Assessment: Danger to Self:  No Self-injurious Behavior: No Danger to Others: No Duty to Warn:no Physical Aggression / Violence:No  Access to Firearms a concern: No  Gang Involvement:No   Subjective: Patient was present for session.  Patient reported that things continue to move in a positive direction with her family.  Patient shared the updates from the holidays and how things went with everybody.  Patient went on to share she is looking to make lots of changes in her family and in her own this year.  Patient shared she is starting to acknowledge that she has done many things for a long time that she does not want to do that she has to make some changes rather than being angry.  Different ways to make changes in her life were discussed with patient.  She reported a desire to feel differently towards her husband.  Ways to discuss that issue with him were discussed in session and plans were developed.  She was able identify self talk and CBT filters to help her move in a positive direction over the next few weeks.  Patient also developed a plan to discuss going to the beach with her mother and sister.  Patient was able to recognize what she is willing to do what she is not willing to do  concerning the trip, and how to present that to her mother and sister  Interventions: Cognitive Behavioral Therapy and Solution-Oriented/Positive Psychology  Diagnosis:   ICD-10-CM   1. Generalized anxiety disorder F41.1     Plan: 1.  Patient to continue to engage in individual counseling 2-4 times a month or as needed. 2.  Patient to identify and apply CBT, coping skills learned in session to decrease anxiety symptoms. 3.  Patient to contact this office, go to the local ED or call 911 if a crisis or emergency develops between visits.  [name], [place]

## 2018-10-31 ENCOUNTER — Ambulatory Visit: Payer: BLUE CROSS/BLUE SHIELD | Admitting: Psychiatry

## 2018-11-07 ENCOUNTER — Other Ambulatory Visit (HOSPITAL_BASED_OUTPATIENT_CLINIC_OR_DEPARTMENT_OTHER): Payer: Self-pay | Admitting: Obstetrics and Gynecology

## 2018-11-07 DIAGNOSIS — Z1231 Encounter for screening mammogram for malignant neoplasm of breast: Secondary | ICD-10-CM

## 2018-11-26 ENCOUNTER — Ambulatory Visit: Payer: BLUE CROSS/BLUE SHIELD | Admitting: Psychiatry

## 2018-12-08 ENCOUNTER — Ambulatory Visit: Payer: BLUE CROSS/BLUE SHIELD | Admitting: Psychiatry

## 2018-12-12 ENCOUNTER — Encounter: Payer: Self-pay | Admitting: Psychiatry

## 2018-12-12 ENCOUNTER — Other Ambulatory Visit: Payer: Self-pay

## 2018-12-12 ENCOUNTER — Ambulatory Visit: Payer: BLUE CROSS/BLUE SHIELD | Admitting: Psychiatry

## 2018-12-12 DIAGNOSIS — F411 Generalized anxiety disorder: Secondary | ICD-10-CM

## 2018-12-12 NOTE — Progress Notes (Signed)
      [facility] Counselor/Therapist Progress Note  Patient ID: Mariah [name], MRN: [id number],    Date: 12/12/2018  Time Spent: 48 minutes  Treatment Type: Individual Therapy  Reported Symptoms: anxiety, sadness,sleep issues  Mental Status Exam:  Appearance:   Casual     Behavior:  Appropriate  Motor:  Normal  Speech/Language:   Normal Rate  Affect:  Appropriate  Mood:  normal  Thought process:  normal  Thought content:    WNL  Sensory/Perceptual disturbances:    WNL  Orientation:  oriented to person, place and time/date  Attention:  Good  Concentration:  Good  Memory:  WNL  Fund of knowledge:   Good  Insight:    Good  Judgment:   Good  Impulse Control:  Good   Risk Assessment: Danger to Self:  No Self-injurious Behavior: No Danger to Others: No Duty to Warn:no Physical Aggression / Violence:No  Access to Firearms a concern: No  Gang Involvement:No   Subjective: Patient is present for session.  She shared she went to her 3rd memorial for a child of a friend that had overdosed.  Patient explained that is been very difficult for her and it is made her really look at things in her family situation.  It is brought up more concerned about her husband's drinking and also her son's drinking.  Discussed CBT filters to use to help her maintain perspective.   Patient was encouraged to focus on working on herself and being the best she can be to model appropriate behaviors for her husband and son.  She was also encouraged to remind herself regularly she has to focus on what she can control fix or change in picture and letting the rest of it go.  Was encouraged to remind herself she has made great progress and that will continue as she focuses on her.  Patient was able to report decrease in anxiety and an increase in her sleep which are both positive changes.  Interventions: Solution-Oriented/Positive Psychology,CBT  Diagnosis:   ICD-10-CM   1. Generalized anxiety disorder F41.1      Plan: 1.  Patient to continue to engage in individual counseling 2-4 times a month or as needed. 2.  Patient to identify and apply CBT, coping skills learned in session to decrease anxiety symptoms. 3.  Patient to contact this office, go to the local ED or call 911 if a crisis or emergency develops between visits.  [name], [place]    This record has been created using [employer].  Chart creation errors have been sought, but may not always have been located and corrected. Such creation errors do not reflect on the standard of medical care.

## 2018-12-22 ENCOUNTER — Ambulatory Visit (HOSPITAL_BASED_OUTPATIENT_CLINIC_OR_DEPARTMENT_OTHER): Payer: BLUE CROSS/BLUE SHIELD

## 2018-12-26 ENCOUNTER — Ambulatory Visit: Payer: BLUE CROSS/BLUE SHIELD | Admitting: Psychiatry

## 2018-12-30 ENCOUNTER — Ambulatory Visit (INDEPENDENT_AMBULATORY_CARE_PROVIDER_SITE_OTHER): Payer: BLUE CROSS/BLUE SHIELD | Admitting: Psychiatry

## 2018-12-30 DIAGNOSIS — F411 Generalized anxiety disorder: Secondary | ICD-10-CM | POA: Diagnosis not present

## 2018-12-30 NOTE — Progress Notes (Signed)
[facility] Counselor/Therapist Progress Note  Patient ID: Mariah [name], MRN: [id number],    Date: 12/30/2018  Time Spent: 52 minutes Started 3:57  End time 4:47 Virtual Visit via Telephone Note I connected with patient by a video enabled telemedicine application or telephone, with their informed consent, and verified patient privacy and that I am speaking with the correct person using two identifiers.    I discussed the limitations, risks, security and privacy concerns of performing psychotherapy and management service by telephone and the availability of in person appointments. I also discussed with the patient that there may be a patient responsible charge related to this service. The patient expressed understanding and agreed to proceed.  I discussed the treatment planning with the patient. The patient was provided an opportunity to ask questions and all were answered. The patient agreed with the plan and demonstrated an understanding of the instructions.   The patient was advised to call  our office if  symptoms worsen or feel they are in a crisis state and need immediate contact.  Patient was at home and clinician was at home  Treatment Type: Individual Therapy  Reported Symptoms: anxiety, sleep issues  Mental Status Exam:  Appearance:   NA     Behavior:  Sharing  Motor:  Normal  Speech/Language:   Normal Rate  Affect:  NA  Mood:  anxious  Thought process:  normal  Thought content:    Rumination  Sensory/Perceptual disturbances:    WNL  Orientation:  oriented to person, place, time/date and situation  Attention:  Good  Concentration:  Good  Memory:  WNL  Fund of knowledge:   Good  Insight:    Good  Judgment:   Good  Impulse Control:  Good   Risk Assessment: Danger to Self:  No Self-injurious Behavior: No Danger to Others: No Duty to Warn:no Physical Aggression / Violence:No  Access to Firearms a concern: No  Gang Involvement:No   Subjective: Met with  patient via phone due to situation.  Patient reported her anxiety has increased during this time.  She reported she had been doing better concerning her children but with the staying at home order she is very concerned about her son's mental health especially.  Patient shared she is trying to use her coping skills but finding it difficult to let go.  Patient was allowed time to discuss her current concerns about his situation.  During the processing she was encouraged to recognize that at this time there seems to be some positives that are typically not there that should keep him at a good place.  Patient was encouraged to remind herself regularly to focus on the things she can control fix and change.  She is to visualize letting go of things she cannot control.  Different tools to help her do that were discussed with patient.  Patient also discussed the difficulty having her husband working from home since she is used to her alone time.  Ways to still make that occur and the importance of her getting out and walking regularly was discussed with patient.  Patient agreed to utilize her tools and plans from session.  Interventions: Cognitive Behavioral Therapy and Solution-Oriented/Positive Psychology  Diagnosis:   ICD-10-CM   1. Generalized anxiety disorder F41.1     Plan: 1.  Patient to continue to engage in individual counseling 2-4 times a month or as needed. 2.  Patient to identify and apply CBT, coping skills learned in session to decrease  anxiety symptoms. 3.  Patient to contact this office, go to the local ED or call 911 if a crisis or emergency develops between visits.  Lina Sayre, [place]  This record has been created using [employer].  Chart creation errors have been sought, but may not always have been located and corrected. Such creation errors do not reflect on the standard of medical care.

## 2019-01-01 ENCOUNTER — Encounter: Payer: Self-pay | Admitting: Psychiatry

## 2019-03-03 ENCOUNTER — Other Ambulatory Visit: Payer: Self-pay

## 2019-03-03 ENCOUNTER — Encounter: Payer: Self-pay | Admitting: Psychiatry

## 2019-03-03 ENCOUNTER — Ambulatory Visit (INDEPENDENT_AMBULATORY_CARE_PROVIDER_SITE_OTHER): Payer: BC Managed Care – PPO | Admitting: Psychiatry

## 2019-03-03 DIAGNOSIS — F411 Generalized anxiety disorder: Secondary | ICD-10-CM

## 2019-03-03 NOTE — Progress Notes (Signed)
      [facility] Counselor/Therapist Progress Note  Patient ID: Mariah [name], MRN: [id number],    Date: 03/03/2019  Time Spent: 52 minutes start time 10:00 AM end time 10:52 AM  Treatment Type: Individual Therapy  Reported Symptoms: anxiety, sadness, sleep issues  Mental Status Exam:  Appearance:   Casual     Behavior:  Appropriate  Motor:  Normal  Speech/Language:   Normal Rate  Affect:  Appropriate  Mood:  normal  Thought process:  normal  Thought content:    WNL  Sensory/Perceptual disturbances:    WNL  Orientation:  oriented to person, place, time/date and situation  Attention:  Good  Concentration:  Good  Memory:  WNL  Fund of knowledge:   Good  Insight:    Good  Judgment:   Good  Impulse Control:  Good   Risk Assessment: Danger to Self:  No Self-injurious Behavior: No Danger to Others: No Duty to Warn:no Physical Aggression / Violence:No  Access to Firearms a concern: No  Gang Involvement:No   Subjective: Met with patient.  Patient reported she has been having lots of struggles over the past several weeks.  She explained she has been using her coping skills and they have been helping.  She shared she wanted to discuss the difference issues with her different family members that were creating stress for her.  Patient explained that the restaurant that the patient's daughter has been working on was destroyed during [employer] and now she is very upset and traumatized by the loss.  Discussed the importance of allowing her time just to figure out what she is going to do and to trust her as an adult to get to the other side of what happened.  She also shared that there were some issues and stressors with her son but she recognizes she has to allow him to make his own decisions and just deal with how things work themselves to help.  Patient shared that her mother-in-law is having lots of difficulties with losing things in her room and they cannot go help her so that is creating  stress for her husband.  Patient shared through the stress her husband has been drinking more and that is really concerning her discussed how to deal with that with him and how to help him recognize that you were concerned more about his mental health than anything else.  Patient reported she is not sure that she could do that without a mediator discussed options with patient and plans were developed in session.  Patient was encouraged to continue taking care of herself and to set positive limits with others  Interventions: Solution-Oriented/Positive Psychology  Diagnosis:   ICD-10-CM   1. Generalized anxiety disorder F41.1     Plan: 1.  Patient to continue to engage in individual counseling 2-4 times a month or as needed. 2.  Patient to identify and apply CBT, coping skills learned in session to decrease anxiety symptoms. 3.  Patient to contact this office, go to the local ED or call 911 if a crisis or emergency develops between visits.  Lina Sayre, [facility]   This record has been created using [employer].  Chart creation errors have been sought, but may not always have been located and corrected. Such creation errors do not reflect on the standard of medical care.

## 2019-03-09 ENCOUNTER — Other Ambulatory Visit: Payer: Self-pay

## 2019-03-09 ENCOUNTER — Encounter (HOSPITAL_BASED_OUTPATIENT_CLINIC_OR_DEPARTMENT_OTHER): Payer: Self-pay

## 2019-03-09 ENCOUNTER — Ambulatory Visit (HOSPITAL_BASED_OUTPATIENT_CLINIC_OR_DEPARTMENT_OTHER)
Admission: RE | Admit: 2019-03-09 | Discharge: 2019-03-09 | Disposition: A | Discharge status: Home / Self Care | Payer: BC Managed Care – PPO | Source: Ambulatory Visit | Attending: Obstetrics and Gynecology | Admitting: Obstetrics and Gynecology

## 2019-03-09 DIAGNOSIS — Z1231 Encounter for screening mammogram for malignant neoplasm of breast: Secondary | ICD-10-CM | POA: Diagnosis not present

## 2019-03-24 ENCOUNTER — Ambulatory Visit: Payer: BC Managed Care – PPO | Admitting: Psychiatry

## 2019-08-07 ENCOUNTER — Ambulatory Visit: Payer: BC Managed Care – PPO | Admitting: Psychiatry

## 2019-09-02 ENCOUNTER — Encounter: Payer: Self-pay | Admitting: *Deleted

## 2019-09-07 ENCOUNTER — Encounter: Payer: Self-pay | Admitting: Psychiatry

## 2019-09-07 ENCOUNTER — Ambulatory Visit (INDEPENDENT_AMBULATORY_CARE_PROVIDER_SITE_OTHER): Payer: BC Managed Care – PPO | Admitting: Psychiatry

## 2019-09-07 ENCOUNTER — Other Ambulatory Visit: Payer: Self-pay

## 2019-09-07 ENCOUNTER — Telehealth: Payer: Self-pay | Admitting: Psychiatry

## 2019-09-07 VITALS — BP 170/84 | HR 75

## 2019-09-07 DIAGNOSIS — F411 Generalized anxiety disorder: Secondary | ICD-10-CM | POA: Diagnosis not present

## 2019-09-07 DIAGNOSIS — F5101 Primary insomnia: Secondary | ICD-10-CM

## 2019-09-07 MED ORDER — LORAZEPAM 0.5 MG PO TABS: ORAL_TABLET | ORAL | 0 refills | Status: DC

## 2019-09-07 NOTE — Progress Notes (Signed)
Mariah [name] [id number] 10/09/1960 58 y.o.  Subjective:   Patient ID:  Mariah [name] is a 58 y.o. (DOB [birth year]) female.  Chief Complaint:  Chief Complaint  Patient presents with  . Panic Attack  . Anxiety  . Insomnia    HPI Mariah [name] presents to the office today emergently for anxiety, panic, and insomnia. Seen today for first time since 03/25/18. Reports awakening during the night with acute anxiety/panic. Continuing to have severe anxiety and panic today. Denies any clear trigger to panic. Reports that she may have had a panic attack a few weeks ago. She reports that she has not been feeling well for a few weeks. Reports that sleep is chronically poor and never more than 5-6 hours and typically is in 2 hour fragments. Reports anxiety tends to be more severe at night. Reports that she is unable to sleep during the day. Now has her own bedroom since husband snores. "I have been extremely active" to include walking, hiking, kayaking. Denies depressed mood. She reports that she is eating well and appetite has been unchanged. She reports that concentration has been poor. Denies SI.   Reports feeling "hot" and then feeling cold. Having some dizziness. She reports, "I can't even function today." She reports energy is low- "what I want to do is sleep, but my body won't let me." Reports feeling physically tired but unable to rest.   Mother was ill over the the last few months and pt has helped her. Reports that she has had several recent stressors. Some people she knows have been dx'd with COVID> She retired in March and husband has been working from home since March. Friend has been dx'd with Stage IV Cancer.   Past Psychiatric Medication Trials: Silenor Belsomra-side effects, minimal efficacy Ambien- Parasomnias Benadryl- excessive somnolence Melatonin- ineffective Lexapro- adverse effects Zoloft-adverse effects Ativan- Effective, has grogginess that carried over the next day. Has taken 0.5  mg.    Review of Systems:  Review of Systems  Eyes:       Reports that her eyes "ache" and feel tired."   Endocrine:       Reports feeling hot and then suddenly cold, and vice versa  Musculoskeletal: Negative for gait problem.       Muscle tension  Neurological: Positive for dizziness. Negative for tremors.  Psychiatric/Behavioral:       Please refer to HPI    Medications: I have reviewed the patient's current medications.  Current Outpatient Medications  Medication Sig Dispense Refill  . acetaminophen (TYLENOL) 325 MG tablet Take 650 mg by mouth every 6 (six) hours as needed.    . Ascorbic Acid (VITA-C PO) Take by mouth.    . Multiple Vitamins-Minerals (ZINC PO) Take by mouth.    . Omega-3 Fatty Acids (FISH OIL PO) Take by mouth.    . TURMERIC PO Take by mouth.    Marland Kitchen LORazepam (ATIVAN) 0.5 MG tablet Take 1/2-1 tab po TID prn anxiety and insomnia 90 tablet 0   No current facility-administered medications for this visit.     Medication Side Effects: Other: N/A  Allergies: No Known Allergies  Past Medical History:  Diagnosis Date  . Osteopenia   . Vision abnormalities     Family History  Problem Relation Age of Onset  . Hypertension Mother   . Melanoma Father   . Multiple myeloma Father   . Anxiety disorder Daughter   . Depression Son     Social History   Socioeconomic History  .  Marital status: Married    Spouse name: Not on file  . Number of children: Not on file  . Years of education: Not on file  . Highest education level: Not on file  Occupational History  . Not on file  Social Needs  . Financial resource strain: Not on file  . Food insecurity    Worry: Not on file    Inability: Not on file  . Transportation needs    Medical: Not on file    Non-medical: Not on file  Tobacco Use  . Smoking status: Former Smoker    Quit date: 10/02/1983    Years since quitting: 35.9  . Smokeless tobacco: Never Used  Substance and Sexual Activity  . Alcohol use:  No  . Drug use: No  . Sexual activity: Not on file  Lifestyle  . Physical activity    Days per week: Not on file    Minutes per session: Not on file  . Stress: Not on file  Relationships  . Social Herbalist on phone: Not on file    Gets together: Not on file    Attends religious service: Not on file    Active member of club or organization: Not on file    Attends meetings of clubs or organizations: Not on file    Relationship status: Not on file  . Intimate partner violence    Fear of current or ex partner: Not on file    Emotionally abused: Not on file    Physically abused: Not on file    Forced sexual activity: Not on file  Other Topics Concern  . Not on file  Social History Narrative  . Not on file    Past Medical History, Surgical history, Social history, and Family history were reviewed and updated as appropriate.   Please see review of systems for further details on the patient's review from today.   Objective:   Physical Exam:  BP (!) 170/84   Pulse 75   LMP 07/14/2011   Physical Exam Constitutional:      General: She is in acute distress.     Appearance: She is well-developed.  Musculoskeletal:        General: No deformity.  Neurological:     Mental Status: She is alert and oriented to person, place, and time.     Coordination: Coordination normal.  Psychiatric:        Attention and Perception: Perception normal. She is inattentive. She does not perceive auditory or visual hallucinations.        Mood and Affect: Mood is anxious. Mood is not depressed. Affect is not labile, blunt, angry or inappropriate.        Speech: Speech normal.        Thought Content: Thought content normal. Thought content is not paranoid or delusional. Thought content does not include homicidal or suicidal ideation. Thought content does not include homicidal or suicidal plan.        Cognition and Memory: Cognition and memory normal.        Judgment: Judgment normal.      Comments: Restless, wringing hands, frequently repositioning in chair Insight intact. No delusions.      Lab Review:     Component Value Date/Time   NA 141 06/28/2014 1618   K 4.0 06/28/2014 1618   CL 103 06/28/2014 1618   CO2 28 06/28/2014 1607   GLUCOSE 105 (H) 06/28/2014 1618   BUN 16 06/28/2014 1618  CREATININE 0.80 06/28/2014 1618   CALCIUM 9.5 06/28/2014 1607   GFRNONAA >90 06/28/2014 1607   GFRAA >90 06/28/2014 1607       Component Value Date/Time   WBC 8.1 06/28/2014 1607   RBC 4.27 06/28/2014 1607   HGB 13.9 06/28/2014 1618   HCT 41.0 06/28/2014 1618   PLT 227 06/28/2014 1607   MCV 87.8 06/28/2014 1607   MCH 30.2 06/28/2014 1607   MCHC 34.4 06/28/2014 1607   RDW 12.9 06/28/2014 1607   LYMPHSABS 1.7 06/28/2014 1607   MONOABS 0.6 06/28/2014 1607   EOSABS 0.1 06/28/2014 1607   BASOSABS 0.0 06/28/2014 1607    No results found for: POCLITH, LITHIUM   No results found for: PHENYTOIN, PHENOBARB, VALPROATE, CBMZ   .res Assessment: Plan:   Patient seen for 45 minutes and greater than 50% of visit spent counseling patient and coordinating care, to include reviewing changes in medical history and social history in the last year and a half.  Also counseled patient regarding strategies to improve acute anxiety and panic signs and symptoms to include scheduling a benzodiazepine for several days to control acute anxiety and improve sleep.  Discussed using Ativan since patient has taken Ativan in the past with good response and minimal tolerability issues.  Discussed only starting Ativan at this time since patient has had anxiety in the past regarding starting new medications, and starting an unfamiliar medication may worsen anxiety at this time.  Discussed improving acute anxiety with Ativan and then considering starting a maintenance medication for anxiety in 1 to 2 weeks. Discussed taking Ativan 0.5 milligrams 1/2 to 1 tablet 3 times daily as needed for anxiety and insomnia.   Discussed that she may have less drowsiness with Ativan at this time due to high level of anxiety and panic, compared to the past.  Recommend taking 1 tablet at bedtime every night until follow-up and that she could take 1/2 tablet of Ativan during the day if 1 tab is too sedating. Agree with plan to resume psychotherapy with Lina Sayre, LPC.   Patient to follow-up with this provider in 2 weeks or sooner if clinically indicated. Patient advised to contact office with any questions, adverse effects, or acute worsening in signs and symptoms.  Collene was seen today for panic attack, anxiety and insomnia.  Diagnoses and all orders for this visit:  Generalized anxiety disorder -     LORazepam (ATIVAN) 0.5 MG tablet; Take 1/2-1 tab po TID prn anxiety and insomnia  Primary insomnia -     LORazepam (ATIVAN) 0.5 MG tablet; Take 1/2-1 tab po TID prn anxiety and insomnia     Please see After Visit Summary for patient specific instructions.  Future Appointments  Date Time Provider [place]  09/11/2019  9:00 AM Lina Sayre, [facility] None  09/15/2019  9:00 AM [name], PMHNP [facility] None  10/13/2019  1:30 PM Megan Salon, MD [place] None    No orders of the defined types were placed in this encounter.   -------------------------------

## 2019-09-07 NOTE — Telephone Encounter (Signed)
Has not been seen in over a year. Is having difficulty with sleep, increase in anxiety, can't think and feels debilitated.  Made appt for today 12/7

## 2019-09-07 NOTE — Patient Instructions (Signed)
Take one Lorazepam this afternoon when you get home. Take another Lorazepam before bedtime tonight.   Recommend continuing to take Lorazepam 0.5 mg every night before bed.  Recommend taking Lorazepam 2-3 times daily over the next 5 days to address acute anxiety. Can take 1/2 tab of Lorazepam in the morning and mid-day and 1 tablet at bedtime if taking one tablet during the day is too sedating.   Take one tablet if panic occurs.   Call office with any questions or if Lorazepam is not relieving panic/anxiety.

## 2019-09-11 ENCOUNTER — Other Ambulatory Visit: Payer: Self-pay

## 2019-09-11 ENCOUNTER — Encounter: Payer: Self-pay | Admitting: Psychiatry

## 2019-09-11 ENCOUNTER — Ambulatory Visit (INDEPENDENT_AMBULATORY_CARE_PROVIDER_SITE_OTHER): Payer: BC Managed Care – PPO | Admitting: Psychiatry

## 2019-09-11 DIAGNOSIS — F411 Generalized anxiety disorder: Secondary | ICD-10-CM

## 2019-09-11 NOTE — Progress Notes (Signed)
[facility] Counselor/Therapist Progress Note  Patient ID: Mariah [name], MRN: [id number],    Date: 09/11/2019  Time Spent: 52 minutes start time 9:01 AM end time 9:53 AM Virtual Visit via Telephone Note Connected with patient by a video enabled telemedicine/telehealth application or telephone, with their informed consent, and verified patient privacy and that I am speaking with the correct person using two identifiers. I discussed the limitations, risks, security and privacy concerns of performing psychotherapy and management service by telephone and the availability of in person appointments. I also discussed with the patient that there may be a patient responsible charge related to this service. The patient expressed understanding and agreed to proceed. I discussed the treatment planning with the patient. The patient was provided an opportunity to ask questions and all were answered. The patient agreed with the plan and demonstrated an understanding of the instructions. The patient was advised to call  our office if  symptoms worsen or feel they are in a crisis state and need immediate contact.   Therapist Location: office Patient Location: home    Treatment Type: Individual Therapy  Reported Symptoms: anxiety, sleep issues, panic  Mental Status Exam:  Appearance:   NA     Behavior:  Sharing  Motor:  none  Speech/Language:   Normal Rate  Affect:  NA  Mood:  anxious  Thought process:  normal  Thought content:    WNL  Sensory/Perceptual disturbances:    WNL  Orientation:  oriented to person, place, time/date and situation  Attention:  Good  Concentration:  Good  Memory:  WNL  Fund of knowledge:   Fair  Insight:    Fair  Judgment:   Good  Impulse Control:  Good   Risk Assessment: Danger to Self:  No Self-injurious Behavior: No Danger to Others: No Duty to Warn:no Physical Aggression / Violence:No  Access to Firearms a concern: No  Gang Involvement:No   Subjective:  Met with patient via virtual session.  She shared that she retired and that has been a positive thing and she was doing well over the summer because [place] the [place] a lot and that had helped her to stay better.  Her mother got sick the 1st of September.  Patient reported that she is feeling responsibility, with the situation.  Her mother ended up having lots of anxiety even though she felt something was wrong with her but there has been nothing found. She shared that she has learned a lot about what she has to do for mother and sister.  She explained she has gotten more prepared and has taken over all of her medical.  Patient stated her mother is doing much better since she started walking everyday and is off of some of her medications.  Patient stated the whole situation was overwhelming for her because she never knew what  Was going to happen.  Patient stated her whole family went on a vacation together for the 1st time in 5 years and that was anxiety producing because things were not completely resolved with her children.  Patient went on to reported that her daughter is not doing well with all that is going on with the pandemic.  Patient stated that she gets real worried about her daughter and she shared that she knows that her daughter won't talk with her at times due to the stress it puts on her.  Discussed importance of putting things on the table with her daughter so that  they can figure out how to handle things together.  She also shared that she is worried now also about her son he was finally stable with a new girlfriend, but she has just moved to [place] and now she is concerned. Her husband has been working from home since March and he will be there until April at least so that it making things even worse.  Patient was reminded that she has to focus on the things that she can control fix and change in most of the things going on in her world she cannot control or do anything about.  She was encouraged  to use her self-care and CBT skills to help stay grounded and remind herself of the truth.  Patient was also encouraged to start writing down the things that have worked out well with her children mother and sister so she can remind herself that even when things do not feel like they were going to be okay they will.  Updated treatment plan in session reviewed with patient but that she was not present it could not be signed.  Patient was scheduled for an earlier appointment to start working on doing some processing on the issues to help resolve them.  Interventions: Cognitive Behavioral Therapy and Solution-Oriented/Positive Psychology  Diagnosis:   ICD-10-CM   1. Generalized anxiety disorder  F41.1     Plan: Patient is to utilize CBT and coping skills to help manage anxiety appropriately.  Patient is to continue hiking and trying to have safe time with friends whenever possible outside.  Patient is also to start writing down the positive things that have occurred with her children and her mother and sister to remind herself that things are going to be okay. Long term goal: Enhance ability to handle the full variety of life's anxieties Short term goal: Verbalize an understanding of the role that fearful thinking plays in creating fears, excessive worry, and persistent anxiety symptoms Identify the major life conflicts from [place], [facility]

## 2019-09-14 ENCOUNTER — Ambulatory Visit (INDEPENDENT_AMBULATORY_CARE_PROVIDER_SITE_OTHER): Payer: BC Managed Care – PPO | Admitting: Psychiatry

## 2019-09-14 DIAGNOSIS — F411 Generalized anxiety disorder: Secondary | ICD-10-CM

## 2019-09-14 NOTE — Progress Notes (Signed)
[facility] Counselor/Therapist Progress Note  Patient ID: Mariah [name], MRN: [id number],    Date: 09/14/2019  Time Spent: 52 minutes start time 8:04 a.m. and time 8:56 AM Virtual Visit via Telephone Note Connected with patient by a video enabled telemedicine/telehealth application or telephone, with their informed consent, and verified patient privacy and that I am speaking with the correct person using two identifiers. I discussed the limitations, risks, security and privacy concerns of performing psychotherapy and management service by telephone and the availability of in person appointments. I also discussed with the patient that there may be a patient responsible charge related to this service. The patient expressed understanding and agreed to proceed. I discussed the treatment planning with the patient. The patient was provided an opportunity to ask questions and all were answered. The patient agreed with the plan and demonstrated an understanding of the instructions. The patient was advised to call  our office if  symptoms worsen or feel they are in a crisis state and need immediate contact.   Therapist Location: office Patient Location: home   Treatment Type: Individual Therapy  Reported Symptoms: anxiety, sleep issues, memory issues  Mental Status Exam:  Appearance:   NA     Behavior:  Sharing  Motor:  none  Speech/Language:   Normal Rate  Affect:  NA  Mood:  anxious  Thought process:  normal  Thought content:    Rumination  Sensory/Perceptual disturbances:    WNL  Orientation:  oriented to person, place and time/date  Attention:  Good  Concentration:  Good  Memory:  Immediate;   [place] of knowledge:   Good  Insight:    Good  Judgment:   Good  Impulse Control:  Good   Risk Assessment: Danger to Self:  No Self-injurious Behavior: No Danger to Others: No Duty to Warn:no Physical Aggression / Violence:No  Access to Firearms a concern: No  Gang  Involvement:No   Subjective: Met with patient via phone.  Tried to do a Webex session but patient was unable to get connected.  She shared that she got outside over the weekend and that was a positive thing.  She went on to share when she was able to take the medication as directed she was able to sleep better but she felt that it was impacting her memory so she backed off the medication during the day.  She shared that she is realizing that when she sleeps her pain is better so she knows she needs to do what she needs to do to get her anxiety under control.  Patient also reported that she is realizing the issues with her daughter cause a lot of her anxiety.  She went on to explain she has to figure out how to let her daughter have her own issues rather than worrying all the time about her.   Discussed different ways that patient's daughter has come to the other side of very difficult situations and the importance of patient reminding herself of those things.  Patient was able to acknowledge that her daughter is very strong and does seem to get to the other side of things.  Patient was encouraged to focus on the things that she can do something about like inviting her over for dinner or to go with her on a hike but to acknowledge the fact that she is capable of taking care of herself and working through her own issues.    Interventions: Cognitive Behavioral Therapy and  Solution-Oriented/Positive Psychology  Diagnosis:   ICD-10-CM   1. Generalized anxiety disorder  F41.1     Plan: Patient is to utilize CBT and coping skills to help decrease anxiety symptoms.  Patient is to work on reminding herself of the truth concerning her daughter that she is capable of making good decisions and she has to have both that she will be okay.  Patient is to continue exercising to release her anxiety and appropriate manner Long-term goal: Enhance ability to handle effectively the full variety of life's  anxieties Short-term goal: Identify the major life complex from past and present the form the basis for present anxiety Verbalize an understanding of the role that fearful thinking plays in creating fears excessive worries and persistent anxiety symptoms Lina Sayre, [facility]

## 2019-09-15 ENCOUNTER — Encounter: Payer: Self-pay | Admitting: Psychiatry

## 2019-09-15 ENCOUNTER — Ambulatory Visit (INDEPENDENT_AMBULATORY_CARE_PROVIDER_SITE_OTHER): Payer: BC Managed Care – PPO | Admitting: Psychiatry

## 2019-09-15 DIAGNOSIS — F411 Generalized anxiety disorder: Secondary | ICD-10-CM | POA: Diagnosis not present

## 2019-09-15 DIAGNOSIS — F5101 Primary insomnia: Secondary | ICD-10-CM

## 2019-09-15 NOTE — Progress Notes (Signed)
Mariah [name] [id number] 18-Jan-1961 58 y.o.  Virtual Visit via Telephone Note  I connected with pt on 09/15/19 at  9:00 AM EST by telephone and verified that I am speaking with the correct person using two identifiers.   I discussed the limitations, risks, security and privacy concerns of performing an evaluation and management service by telephone and the availability of in person appointments. I also discussed with the patient that there may be a patient responsible charge related to this service. The patient expressed understanding and agreed to proceed.   I discussed the assessment and treatment plan with the patient. The patient was provided an opportunity to ask questions and all were answered. The patient agreed with the plan and demonstrated an understanding of the instructions.   The patient was advised to call back or seek an in-person evaluation if the symptoms worsen or if the condition fails to improve as anticipated.  I provided 30 minutes of non-face-to-face time during this encounter.  The patient was located at home.  The provider was located at [place].   [name], PMHNP   Subjective:   Patient ID:  Mariah [name] is a 58 y.o. (DOB 1960-12-31) female.  Chief Complaint:  Chief Complaint  Patient presents with  . Anxiety  . Panic Attack  . Insomnia    HPI [name] presents for follow-up of anxiety and insomnia. She reports "I'm doing better than I was a week ago." She reports that she continues to have some anxiety and insomnia.  She noticed less muscle tension last week and "my body felt better." She reports that her sleep improved with Ativan and slept 7 hours the first night she took Ativan and afterwards sleeping until 4-5 am. She reports some mornings she experienced some slight anxiety and other mornings was able to return to sleep.   Did not take any medication during the day on Sunday and continued to have some cognitive issues. She reports  that she took Ativan at bedtime Sunday night and then experienced multiple awakenings after 2 am. She reports that she was having some difficulty with memory and concentration prior to starting medication. She reports that she did not take Ativan last night and had poor sleep and severe anxiety and then took around 1:30 am and was unable to fall asleep until 3 am.   She notices some grogginess and difficulty with concentration and memory with Ativan and has been taking less during the day. Reports that one tab caused significant somnolence and has been unable to take 1 tab during the day. "Usually the anxiety during the day does not over take me like it does at night." She reports that taking 1/2 tab of Ativan during the day and 1 tab po QHS seems to be best tolerated for her.   She reports that she has not had any further panic attacks "but I would call what happened last night an anxiety attack." She reports increased anxiety last night after conversation with son telling her that he will be bringing his girlfriend home for the holidays and now she is worrying about if the house will be clean enough, accommodating her dietary preferences, etc.   Denies depressed mood- "I have not had depression in years." Appetite is stable. Energy and motivation have been good and has been staying busy.  Denies SI.   She reports that she has not been able to run recently and is trying to jump rope periodically.   She reports that she  left a burner on and misplaced medication.   Reports that she has tried multiple supplements in the past with little to know benefit.   Reports that her mother has taken Paxil for decades for anxiety.   Past Psychiatric Medication Trials: Silenor Belsomra-side effects, minimal efficacy Ambien- Parasomnias Benadryl- excessive somnolence Melatonin- ineffective Lexapro- adverse effects Zoloft-adverse effects Ativan- Effective, has grogginess that carried over the next day. Has  taken 0.5 mg.   Review of Systems:  Review of Systems  Musculoskeletal: Negative for gait problem.  Neurological: Positive for headaches. Negative for tremors.  Psychiatric/Behavioral:       Please refer to HPI    Medications: I have reviewed the patient's current medications.  Current Outpatient Medications  Medication Sig Dispense Refill  . acetaminophen (TYLENOL) 325 MG tablet Take 650 mg by mouth every 6 (six) hours as needed.    . Ascorbic Acid (VITA-C PO) Take by mouth.    Marland Kitchen LORazepam (ATIVAN) 0.5 MG tablet Take 1/2-1 tab po TID prn anxiety and insomnia 90 tablet 0  . Multiple Vitamins-Minerals (ZINC PO) Take by mouth.    . Omega-3 Fatty Acids (FISH OIL PO) Take by mouth.    . TURMERIC PO Take by mouth.     No current facility-administered medications for this visit.    Medication Side Effects: Sedation and Other: Some concentration difficulties.   Allergies: No Known Allergies  Past Medical History:  Diagnosis Date  . Osteopenia   . Vision abnormalities     Family History  Problem Relation Age of Onset  . Hypertension Mother   . Melanoma Father   . Multiple myeloma Father   . Anxiety disorder Daughter   . Depression Son     Social History   Socioeconomic History  . Marital status: Married    Spouse name: Not on file  . Number of children: Not on file  . Years of education: Not on file  . Highest education level: Not on file  Occupational History  . Not on file  Tobacco Use  . Smoking status: Former Smoker    Quit date: 10/02/1983    Years since quitting: 35.9  . Smokeless tobacco: Never Used  Substance and Sexual Activity  . Alcohol use: No  . Drug use: No  . Sexual activity: Not on file  Other Topics Concern  . Not on file  Social History Narrative  . Not on file   Social Determinants of Health   Financial Resource Strain:   . Difficulty of Paying Living Expenses: Not on file  Food Insecurity:   . Worried About Charity fundraiser in the  Last Year: Not on file  . Ran Out of Food in the Last Year: Not on file  Transportation Needs:   . Lack of Transportation (Medical): Not on file  . Lack of Transportation (Non-Medical): Not on file  Physical Activity:   . Days of Exercise per Week: Not on file  . Minutes of Exercise per Session: Not on file  Stress:   . Feeling of Stress : Not on file  Social Connections:   . Frequency of Communication with Friends and Family: Not on file  . Frequency of Social Gatherings with Friends and Family: Not on file  . Attends Religious Services: Not on file  . Active Member of Clubs or Organizations: Not on file  . Attends Archivist Meetings: Not on file  . Marital Status: Not on file  Intimate Partner Violence:   .  Fear of Current or Ex-Partner: Not on file  . Emotionally Abused: Not on file  . Physically Abused: Not on file  . Sexually Abused: Not on file    Past Medical History, Surgical history, Social history, and Family history were reviewed and updated as appropriate.   Please see review of systems for further details on the patient's review from today.   Objective:   Physical Exam:  LMP 07/14/2011   Physical Exam Neurological:     Mental Status: She is alert and oriented to person, place, and time.     Cranial Nerves: No dysarthria.  Psychiatric:        Attention and Perception: Attention and perception normal.        Mood and Affect: Mood is anxious.        Speech: Speech normal.        Behavior: Behavior is cooperative.        Thought Content: Thought content normal. Thought content is not paranoid or delusional. Thought content does not include homicidal or suicidal ideation. Thought content does not include homicidal or suicidal plan.        Cognition and Memory: Cognition and memory normal.        Judgment: Judgment normal.     Comments: Insight intact     Lab Review:     Component Value Date/Time   NA 141 06/28/2014 1618   K 4.0 06/28/2014 1618    CL 103 06/28/2014 1618   CO2 28 06/28/2014 1607   GLUCOSE 105 (H) 06/28/2014 1618   BUN 16 06/28/2014 1618   CREATININE 0.80 06/28/2014 1618   CALCIUM 9.5 06/28/2014 1607   GFRNONAA >90 06/28/2014 1607   GFRAA >90 06/28/2014 1607       Component Value Date/Time   WBC 8.1 06/28/2014 1607   RBC 4.27 06/28/2014 1607   HGB 13.9 06/28/2014 1618   HCT 41.0 06/28/2014 1618   PLT 227 06/28/2014 1607   MCV 87.8 06/28/2014 1607   MCH 30.2 06/28/2014 1607   MCHC 34.4 06/28/2014 1607   RDW 12.9 06/28/2014 1607   LYMPHSABS 1.7 06/28/2014 1607   MONOABS 0.6 06/28/2014 1607   EOSABS 0.1 06/28/2014 1607   BASOSABS 0.0 06/28/2014 1607    No results found for: POCLITH, LITHIUM   No results found for: PHENYTOIN, PHENOBARB, VALPROATE, CBMZ   .res Assessment: Plan:   Patient seen for 30 minutes and greater than 50% of visit spent counseling patient and coordination of care, to include discussing how to continue to take lorazepam and possible treatment options to help prevent anxiety.  Discussed potential benefits, risks, and side effects of both Paxil and Viibryd since patient has first-degree family members that have tolerated these medications when they have had similar responses that patient has had 2 other medications.  Patient reports that she would like to have more time to think about treatment options and would also prefer to wait until after the holidays and having a houseguest before starting a new medication. Discussed taking Ativan 0.5 mg one half tab daily and 1 tablet at at bedtime to improve anxiety and insomnia since patient has had some improvement with this regimen and minimal tolerability issues.   Recommend continuing psychotherapy with Lina Sayre, [place]. Patient to follow-up with this provider in approximately 3 weeks to discuss long-term treatment options and reevaluate anxiety. Patient advised to contact office with any questions, adverse effects, or acute worsening in signs  and symptoms.  Jala was seen today for  anxiety, panic attack and insomnia.  Diagnoses and all orders for this visit:  Generalized anxiety disorder  Primary insomnia    Please see After Visit Summary for patient specific instructions.  Future Appointments  Date Time Provider [place]  10/01/2019 10:00 AM Lina Sayre, [facility] None  10/13/2019  1:30 PM Megan Salon, MD [place] None    No orders of the defined types were placed in this encounter.     -------------------------------

## 2019-10-01 ENCOUNTER — Ambulatory Visit (INDEPENDENT_AMBULATORY_CARE_PROVIDER_SITE_OTHER): Payer: BC Managed Care – PPO | Admitting: Psychiatry

## 2019-10-01 ENCOUNTER — Encounter: Payer: Self-pay | Admitting: Psychiatry

## 2019-10-01 DIAGNOSIS — F411 Generalized anxiety disorder: Secondary | ICD-10-CM | POA: Diagnosis not present

## 2019-10-01 NOTE — Progress Notes (Signed)
[facility] Counselor/Therapist Progress Note  Patient ID: Mariah [name], MRN: [id number],    Date: 10/01/2019  Time Spent:  52 minutes start time 10:01 end time 10:53 AM Virtual Visit via Telephone Note Connected with patient by a video enabled telemedicine/telehealth application or telephone, with their informed consent, and verified patient privacy and that I am speaking with the correct person using two identifiers. I discussed the limitations, risks, security and privacy concerns of performing psychotherapy and management service by telephone and the availability of in person appointments. I also discussed with the patient that there may be a patient responsible charge related to this service. The patient expressed understanding and agreed to proceed. I discussed the treatment planning with the patient. The patient was provided an opportunity to ask questions and all were answered. The patient agreed with the plan and demonstrated an understanding of the instructions. The patient was advised to call  our office if  symptoms worsen or feel they are in a crisis state and need immediate contact.   Therapist Location: office Patient Location: home    Treatment Type: Individual Therapy  Reported Symptoms: anxiety, muscle tension, sleep issues, melt down  Mental Status Exam:  Appearance:   NA     Behavior:  Sharing  Motor:  na  Speech/Language:   Normal Rate  Affect:  NA  Mood:  anxious  Thought process:  normal  Thought content:    WNL  Sensory/Perceptual disturbances:    WNL  Orientation:  oriented to person, place, time/date and situation  Attention:  Good  Concentration:  Good  Memory:  WNL  Fund of knowledge:   Good  Insight:    Good  Judgment:   Good  Impulse Control:  Good   Risk Assessment: Danger to Self:  No Self-injurious Behavior: No Danger to Others: No Duty to Warn:no Physical Aggression / Violence:No  Access to Firearms a concern: No  Gang  Involvement:No   Subjective: Met with patient via phone.  She shared she is still struggling with taking her meds regularly due to fear of getting addicted.  She stated she is realizing that her body is better when she sleeps.  She shared that overall the Christmas holiday went well.  Her son's girlfriend did come and it was fine.  Her daughter seemed to do well and it seems that there are positive things happening with her work. She explained that she lost it yesterday due to her anxiety. Patient explained her husband went out with his friends to a Education administrator.  She went on to explain that the friend he went out with called and shared that he went out with him even though his wife was waiting on her COVID test results which came back positive.  The friend is getting his test now but it has changed all the plans for the family.  She stated that by him going to the bar for his fun he has ruined the kids plans and it is very frustrating for patient.  Discussed how she can handle it with her husband to help decrease her anxiety and be able to decrease the fighting over the issues. Patient reported she is realizing the fact that he does not respect her option and his drinking are issues making her anxiety worse.  She does have a friend she can stay with down the street.  Encouraged pt to go stay with her for a few days after times when he goes to risky COVID  places so that she can feel safe and let him know once she knows things are safe she will be back.  That way he can chose what he wants and she can feel safe.  Patient reported feeling positive about that plan and hopeful it will decrease her anxiety over the situation. She was encouraged to stay focused on the things she can control fix and change and to try to let go of the other issues.  Interventions: Cognitive Behavioral Therapy and Solution-Oriented/Positive Psychology  Diagnosis:   ICD-10-CM   1. Generalized anxiety disorder  F41.1     Plan:  Patient is to utilize CBT and coping skills to decrease anxiety symptoms.  Patient is to follow plan to go to her friend's house for a few days if her husband continues to engage in behavior she finds risky considering the COVID increase in numbers.  She is also to try and remind herself to focus on the things that she can control fix and change and let go of the other things. Long term goal:Enhance ability to handle effectively the full variety of life's anxieties Short term goal: Identify the major life conflicts from the past and present  That form the basis for the present anxiety Verbalize an understanding of the role that fearful thinking plays in creating fears excessive worry and persistent anxiety symptoms    Lina Sayre, [facility]

## 2019-10-06 ENCOUNTER — Encounter: Payer: Self-pay | Admitting: Psychiatry

## 2019-10-06 ENCOUNTER — Ambulatory Visit (INDEPENDENT_AMBULATORY_CARE_PROVIDER_SITE_OTHER): Payer: BC Managed Care – PPO | Admitting: Psychiatry

## 2019-10-06 DIAGNOSIS — F411 Generalized anxiety disorder: Secondary | ICD-10-CM | POA: Diagnosis not present

## 2019-10-06 DIAGNOSIS — F5101 Primary insomnia: Secondary | ICD-10-CM | POA: Diagnosis not present

## 2019-10-06 MED ORDER — PAROXETINE HCL 10 MG PO TABS: 5.0000 mg | ORAL_TABLET | Freq: Every day | ORAL | 1 refills | Status: DC

## 2019-10-06 NOTE — Progress Notes (Signed)
Mariah [name] [id number] 28-Nov-1960 59 y.o.  Virtual Visit via Telephone Note  I connected with pt on 10/06/19 at 11:00 AM EST by telephone and verified that I am speaking with the correct person using two identifiers.   I discussed the limitations, risks, security and privacy concerns of performing an evaluation and management service by telephone and the availability of in person appointments. I also discussed with the patient that there may be a patient responsible charge related to this service. The patient expressed understanding and agreed to proceed.   I discussed the assessment and treatment plan with the patient. The patient was provided an opportunity to ask questions and all were answered. The patient agreed with the plan and demonstrated an understanding of the instructions.   The patient was advised to call back or seek an in-person evaluation if the symptoms worsen or if the condition fails to improve as anticipated.  I provided  minutes of non-face-to-face time during this encounter.  The patient was located at home.  The provider was located at [place].   [name], PMHNP   Subjective:   Patient ID:  Mariah [name] is a 59 y.o. (DOB 01-Jun-1961) female.  Chief Complaint:  Chief Complaint  Patient presents with  . Anxiety  . Insomnia    HPI Mariah [name] presents for follow-up of anxiety and insomnia. She reports that she is having side effects with Ativan. She reports that Ativan initially was helpful for her sleep and was taking it 5-6 nights a week. She reports that Ativan is no longer as effective for her insomnia. She reports that her anxiety was been controlled during the day until a week ago. She reports that in the last week she is having increased anxiety, HA's, and tearfulness. Anxiety is increased at night. Reports that she is thinking that she is having withdrawal s/s from Ativan and that she feels better when she takes it again. Has been taking  Ativan 0.5 mg po QHS most nights. Sleeping about 4 hours a night. Appetite has been good. Energy and motivation have been good. Concentration is ok aside from when sleep is decreased.   Denies SI.   Reports that she is interested in trial of Paxil since her mother has tolerated this well.   Past Psychiatric Medication Trials: Silenor Belsomra-side effects, minimal efficacy Ambien- Parasomnias Benadryl- excessive somnolence Melatonin- ineffective Lexapro- adverse effects Zoloft-adverse effects Ativan- Effective, has grogginess that carried over the next day. Has taken 0.5 mg.   Review of Systems:  Review of Systems  Musculoskeletal: Negative for gait problem.  Neurological: Positive for headaches. Negative for tremors.  Psychiatric/Behavioral:       Please refer to HPI    Had recent physical exam and reports that lab results were WNL.   Medications:  Current Outpatient Medications  Medication Sig Dispense Refill  . acetaminophen (TYLENOL) 325 MG tablet Take 650 mg by mouth every 6 (six) hours as needed.    . Ascorbic Acid (VITA-C PO) Take by mouth.    Marland Kitchen LORazepam (ATIVAN) 0.5 MG tablet Take 1/2-1 tab po TID prn anxiety and insomnia 90 tablet 0  . Multiple Vitamins-Minerals (ZINC PO) Take by mouth.    . Omega-3 Fatty Acids (FISH OIL PO) Take by mouth.    Marland Kitchen PARoxetine (PAXIL) 10 MG tablet Take 0.5 tablets (5 mg total) by mouth daily. 15 tablet 1  . TURMERIC PO Take by mouth.     No current facility-administered medications for this visit.  Medication Side Effects: Other: Reports possible withdrawal signs and symptoms  Allergies: No Known Allergies  Past Medical History:  Diagnosis Date  . Osteopenia   . Vision abnormalities     Family History  Problem Relation Age of Onset  . Hypertension Mother   . Melanoma Father   . Multiple myeloma Father   . Anxiety disorder Daughter   . Depression Son     Social History   Socioeconomic History  . Marital status:  Married    Spouse name: Not on file  . Number of children: Not on file  . Years of education: Not on file  . Highest education level: Not on file  Occupational History  . Not on file  Tobacco Use  . Smoking status: Former Smoker    Quit date: 10/02/1983    Years since quitting: 36.0  . Smokeless tobacco: Never Used  Substance and Sexual Activity  . Alcohol use: No  . Drug use: No  . Sexual activity: Not on file  Other Topics Concern  . Not on file  Social History Narrative  . Not on file   Social Determinants of Health   Financial Resource Strain:   . Difficulty of Paying Living Expenses: Not on file  Food Insecurity:   . Worried About Charity fundraiser in the Last Year: Not on file  . Ran Out of Food in the Last Year: Not on file  Transportation Needs:   . Lack of Transportation (Medical): Not on file  . Lack of Transportation (Non-Medical): Not on file  Physical Activity:   . Days of Exercise per Week: Not on file  . Minutes of Exercise per Session: Not on file  Stress:   . Feeling of Stress : Not on file  Social Connections:   . Frequency of Communication with Friends and Family: Not on file  . Frequency of Social Gatherings with Friends and Family: Not on file  . Attends Religious Services: Not on file  . Active Member of Clubs or Organizations: Not on file  . Attends Archivist Meetings: Not on file  . Marital Status: Not on file  Intimate Partner Violence:   . Fear of Current or Ex-Partner: Not on file  . Emotionally Abused: Not on file  . Physically Abused: Not on file  . Sexually Abused: Not on file    Past Medical History, Surgical history, Social history, and Family history were reviewed and updated as appropriate.   Please see review of systems for further details on the patient's review from today.   Objective:   Physical Exam:  LMP 07/14/2011   Physical Exam Neurological:     Mental Status: She is alert and oriented to person, place,  and time.     Cranial Nerves: No dysarthria.  Psychiatric:        Attention and Perception: Attention and perception normal.        Mood and Affect: Mood is anxious.        Speech: Speech normal.        Behavior: Behavior is cooperative.        Thought Content: Thought content normal. Thought content is not paranoid or delusional. Thought content does not include homicidal or suicidal ideation. Thought content does not include homicidal or suicidal plan.        Cognition and Memory: Cognition and memory normal.        Judgment: Judgment normal.     Comments: Insight intact  Lab Review:     Component Value Date/Time   NA 141 06/28/2014 1618   K 4.0 06/28/2014 1618   CL 103 06/28/2014 1618   CO2 28 06/28/2014 1607   GLUCOSE 105 (H) 06/28/2014 1618   BUN 16 06/28/2014 1618   CREATININE 0.80 06/28/2014 1618   CALCIUM 9.5 06/28/2014 1607   GFRNONAA >90 06/28/2014 1607   GFRAA >90 06/28/2014 1607       Component Value Date/Time   WBC 8.1 06/28/2014 1607   RBC 4.27 06/28/2014 1607   HGB 13.9 06/28/2014 1618   HCT 41.0 06/28/2014 1618   PLT 227 06/28/2014 1607   MCV 87.8 06/28/2014 1607   MCH 30.2 06/28/2014 1607   MCHC 34.4 06/28/2014 1607   RDW 12.9 06/28/2014 1607   LYMPHSABS 1.7 06/28/2014 1607   MONOABS 0.6 06/28/2014 1607   EOSABS 0.1 06/28/2014 1607   BASOSABS 0.0 06/28/2014 1607    No results found for: POCLITH, LITHIUM   No results found for: PHENYTOIN, PHENOBARB, VALPROATE, CBMZ   .res Assessment: Plan:   Patient seen for 30 minutes and greater than 50% of visit spent counseling patient regarding treatment options for anxiety to include reviewing potential benefits, risks, and side effects of Paxil.  Discussed starting Paxil at very low dose, considering history of medication sensitivity.  Patient agrees to starting Paxil 5 mg daily for anxiety. Discussed patient's concerns about effects of lorazepam and noticing possible withdrawal signs and symptoms prior  to next dose.  Discussed that lorazepam has a short half-life and therefore withdrawal signs and symptoms would be minimized if she takes lorazepam and divided doses.  Recommended taking lorazepam 0.5 mg one half tab p.o. twice daily instead of 1 tab p.o. nightly.  Discussed that lorazepam could then be decreased to one half tab p.o. daily and in later discontinued.  Discussed considering continuing lorazepam until anxiety begins to improve with planned start of Paxil. Recommend continuing psychotherapy with Lina Sayre, [place]. Patient to follow-up in 4 weeks or sooner if clinically indicated. Patient advised to contact office with any questions, adverse effects, or acute worsening in signs and symptoms.  Wonder was seen today for anxiety and insomnia.  Diagnoses and all orders for this visit:  Generalized anxiety disorder -     PARoxetine (PAXIL) 10 MG tablet; Take 0.5 tablets (5 mg total) by mouth daily.  Primary insomnia    Please see After Visit Summary for patient specific instructions.  Future Appointments  Date Time Provider [place]  10/13/2019  1:30 PM Megan Salon, MD [place] None  11/02/2019  3:00 PM Lina Sayre, [facility] None  12/01/2019  1:00 PM Lina Sayre, [facility] None    No orders of the defined types were placed in this encounter.     -------------------------------

## 2019-10-13 ENCOUNTER — Other Ambulatory Visit: Payer: Self-pay

## 2019-10-13 ENCOUNTER — Other Ambulatory Visit (HOSPITAL_COMMUNITY)
Admission: RE | Admit: 2019-10-13 | Discharge: 2019-10-13 | Disposition: A | Discharge status: Home / Self Care | Payer: BC Managed Care – PPO | Source: Ambulatory Visit | Attending: Obstetrics & Gynecology | Admitting: Obstetrics & Gynecology

## 2019-10-13 ENCOUNTER — Ambulatory Visit (INDEPENDENT_AMBULATORY_CARE_PROVIDER_SITE_OTHER): Payer: BC Managed Care – PPO | Admitting: Obstetrics & Gynecology

## 2019-10-13 ENCOUNTER — Encounter: Payer: Self-pay | Admitting: Obstetrics & Gynecology

## 2019-10-13 VITALS — BP 118/64 | HR 88 | Temp 97.7°F | Resp 12 | Ht 64.5 in | Wt 113.8 lb

## 2019-10-13 DIAGNOSIS — Z01419 Encounter for gynecological examination (general) (routine) without abnormal findings: Secondary | ICD-10-CM

## 2019-10-13 DIAGNOSIS — F5104 Psychophysiologic insomnia: Secondary | ICD-10-CM | POA: Diagnosis not present

## 2019-10-13 DIAGNOSIS — Z124 Encounter for screening for malignant neoplasm of cervix: Secondary | ICD-10-CM | POA: Diagnosis not present

## 2019-10-13 DIAGNOSIS — Z1159 Encounter for screening for other viral diseases: Secondary | ICD-10-CM

## 2019-10-13 MED ORDER — ESTRADIOL 0.1 MG/GM VA CREA
TOPICAL_CREAM | VAGINAL | 4 refills | Status: DC
Start: 2019-10-13 — End: 2021-11-15

## 2019-10-13 MED ORDER — ESTRADIOL 0.1 MG/GM VA CREA: 1.0000 | TOPICAL_CREAM | Freq: Every day | VAGINAL | 3 refills | Status: DC

## 2019-10-13 NOTE — Progress Notes (Signed)
59 y.o. [id number] Married White or Caucasian female here as a new patient for an annual exam. Patient has seen Dr. Corinna Capra in the past.  Martin Majestic into menopause about 8 years ago.  Has been on HRT with a patch and had break through bleeding with the past.  She stopped this.  The second time, she had an endometrial polyp and she had to have a procedure.  She stopped HRT.  She really struggles with sleep.  This changed with menopause but this has gotten worse.  Doesn't have hot flashes.  She does have some cold intolerance.  Doesn't have night sweats.  Does have increased anxiety since the last few years.  Has been seeing a therapist for years.  Does yoga and exercise and this helps a lot during the day.    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    Denies vaginal bleeding.     Patient's last menstrual period was 07/14/2011.          Sexually active: Yes.    The current method of family planning is vasectomy.    Exercising: Yes.    walking, hiking, yoga, and weights Smoker:  no  Health Maintenance: Pap:  2019 Per patient  History of abnormal Pap:  no MMG:  03/09/19 BIRADS 1 negative/density c Colonoscopy:  2010 -- patient has appointment for February 2021.  Dr. Cristina Gong.   BMD:   May 2016 Osteopenia.  Discussed having this done again this year. TDaP:  2014 Pneumonia vaccine(s):  n/a Shingrix:  declines Hep C testing: unsure Screening Labs: PCP   reports that she quit smoking about 36 years ago. She has never used smokeless tobacco. She reports that she does not drink alcohol or use drugs.  Past Medical History:  Diagnosis Date  . Anxiety   . Osteopenia   . Vision abnormalities     Past Surgical History:  Procedure Laterality Date  . DILATION AND CURETTAGE OF UTERUS      Current Outpatient Medications  Medication Sig Dispense Refill  . acetaminophen (TYLENOL) 325 MG tablet Take 650 mg by mouth every 6 (six) hours as needed.    . Ascorbic Acid (VITA-C PO) Take by mouth.     Marland Kitchen CALCIUM PO Take by mouth.    Marland Kitchen LORazepam (ATIVAN) 0.5 MG tablet Take 1/2-1 tab po TID prn anxiety and insomnia 90 tablet 0  . Multiple Vitamin (ONE DAILY) tablet Take by mouth.    . Multiple Vitamins-Minerals (ZINC PO) Take by mouth.    . Omega-3 Fatty Acids (FISH OIL PO) Take by mouth.    Marland Kitchen PARoxetine (PAXIL) 10 MG tablet Take 0.5 tablets (5 mg total) by mouth daily. 15 tablet 1  . Theanine 50 MG TBDP Take by mouth.    . TURMERIC PO Take by mouth.    Marland Kitchen VITAMIN D PO Take by mouth.     No current facility-administered medications for this visit.    Family History  Problem Relation Age of Onset  . Hypertension Mother   . Skin cancer Mother   . Melanoma Father   . Multiple myeloma Father   . Anxiety disorder Daughter   . Depression Son     Review of Systems  All other systems reviewed and are negative.   Exam:   BP 118/64 (BP Location: Right Arm, Patient Position: Sitting, Cuff Size: Normal)   Pulse 88   Temp 97.7 F (36.5 C) (Temporal)   Resp 12   Ht  5' 4.5" (1.638 m)   Wt 113 lb 12.8 oz (51.6 kg)   LMP 07/14/2011   BMI 19.23 kg/m      Height: 5' 4.5" (163.8 cm)  Ht Readings from Last 3 Encounters:  10/13/19 5' 4.5" (1.638 m)  02/09/15 '5\' 5"'  (1.651 m)  06/28/14 '5\' 5"'  (1.651 m)    General appearance: alert, cooperative and appears stated age Head: Normocephalic, without obvious abnormality, atraumatic Neck: no adenopathy, supple, symmetrical, trachea midline and thyroid normal to inspection and palpation Lungs: clear to auscultation bilaterally Breasts: normal appearance, no masses or tenderness Heart: regular rate and rhythm Abdomen: soft, non-tender; bowel sounds normal; no masses,  no organomegaly Extremities: extremities normal, atraumatic, no cyanosis or edema Skin: Skin color, texture, turgor normal. No rashes or lesions Lymph nodes: Cervical, supraclavicular, and axillary nodes normal. No abnormal inguinal nodes palpated Neurologic: Grossly  normal  Pelvic: External genitalia:  no lesions              Urethra:  normal appearing urethra with no masses, tenderness or lesions              Bartholins and Skenes: normal                 Vagina: normal appearing vagina with normal color and discharge, no lesions              Cervix: no lesions              Pap taken: No. Bimanual Exam:  Uterus:  normal size, contour, position, consistency, mobility, non-tender              Adnexa: normal adnexa and no mass, fullness, tenderness               Rectovaginal: Confirms               Anus:  normal sphincter tone, no lesions  Chaperone, Terence Lux, CMA, was present for exam.  A:  Well Woman with normal exam PMP, no HRT Vaginal atrophic changes Anxiety Chronic insomnia  P:   Mammogram guidelines reviewed pap smear with HR HPV obtained today Referral to neurology, Dr. Maureen Chatters Estrace cream 1 gram pv twice weekly.  Recheck 3 months. Pt is going to do her BMD with [facility] and will schedule these.  She knows to call if needs our help Colonoscopy is scheduled for 11/2019 with Dr. Cristina Gong Hep C antibody obtained today Vaccines reviewed.  Declines shingrix vaccination at this time return annually or prn

## 2019-10-14 ENCOUNTER — Telehealth: Payer: Self-pay | Admitting: Neurology

## 2019-10-14 LAB — HEPATITIS C ANTIBODY: Hep C Virus Ab: <0.1 s/co ratio (ref 0.0–0.9)

## 2019-10-14 NOTE — Telephone Encounter (Signed)
The pt seen Dr. Epimenio Foot on 02/09/15 for neuro. I know we typically try to keep the pt with the same provider if possible however, per the referring provider the pt is asking to see Dr. Vickey Huger. Are you both okay with this switch?

## 2019-10-15 ENCOUNTER — Ambulatory Visit (INDEPENDENT_AMBULATORY_CARE_PROVIDER_SITE_OTHER): Payer: BC Managed Care – PPO | Admitting: Psychiatry

## 2019-10-15 ENCOUNTER — Encounter: Payer: Self-pay | Admitting: Psychiatry

## 2019-10-15 ENCOUNTER — Other Ambulatory Visit: Payer: Self-pay

## 2019-10-15 DIAGNOSIS — F411 Generalized anxiety disorder: Secondary | ICD-10-CM

## 2019-10-15 LAB — CYTOLOGY - PAP
Comment: NEGATIVE
Diagnosis: NEGATIVE
High risk HPV: NEGATIVE

## 2019-10-15 NOTE — Telephone Encounter (Signed)
This is fine with me   

## 2019-10-15 NOTE — Progress Notes (Signed)
[facility] Counselor/Therapist Progress Note  Patient ID: Mariah [name], MRN: [id number],    Date: 10/15/2019  Time Spent: 50 minutes start time 9:08 AM end time 9:58 AM  Treatment Type: Individual Therapy  Reported Symptoms: anxiety, sleep issues, sadness  Mental Status Exam:  Appearance:   Well Groomed     Behavior:  Sharing  Motor:  Normal  Speech/Language:   Normal Rate  Affect:  Appropriate  Mood:  anxious  Thought process:  normal  Thought content:    Rumination  Sensory/Perceptual disturbances:    WNL  Orientation:  oriented to person, place, time/date and situation  Attention:  Good  Concentration:  Good  Memory:  WNL  Fund of knowledge:   Good  Insight:    Good  Judgment:   Good  Impulse Control:  Good   Risk Assessment: Danger to Self:  No Self-injurious Behavior: No Danger to Others: No Duty to Warn:no Physical Aggression / Violence:No  Access to Firearms a concern: No  Gang Involvement:No   Subjective: Patient was present for session.  She reported she is having a sleep study.  She shared that she is struggling with her daughter who will be moving in with them due to having thoughts of hurting herself.  Patient went on to explain that her son is considering quitting his job and hiking on the [place] trail.  Patient reported she is not sure what to do because she knows her husband will be very upset and understandably why but that she has to let her son do what he wants to do.  Patient also explained the anxiety she would feel when he is out there on his own and she would not be able to contact him if something were to happen.  Patient stated that she recognizes he has to do what he needs to do since he has an adult.  She went on to explain her bigger concern still is her daughter and she is not sure if they can all managed together in the same house.  Discussed different things that she can do to take care of herself and help her focus on those things  rather than focusing on what she needs to do for her daughter.  She was encouraged to have some expectations of her daughter including going to treatment on a regular basis and looking into [place] as a possible option.  Patient explained that her daughter has not responded well to medications but recognizes that with her depression she needs something to help her mood.  Patient also discussed the importance of talking to her husband.  Encouraged her to talk about the fact that she would need him to be sober to be able to communicate with her and the importance of encouraging him to do special things with her daughter so she does not feel all the pressure.  Interventions: Solution-Oriented/Positive Psychology  Diagnosis:   ICD-10-CM   1. Generalized anxiety disorder  F41.1     Plan: Patient is encouraged to use CBT and coping skills to decrease anxiety symptoms.  Patient is to follow plans discussed in session concerning her son and her daughter and the importance of reminding herself that they are adults and can make the decisions they need to about their lives.  Patient is to continue exercising and spending time with friends to try and keep her mood in a positive place. Long-term goal: Enhance ability to handle effectively the full variety of life's anxieties Short-term goal:  Identify the major life complex from the past and present the form the basis for present anxiety Verbalize an understanding of the role that fearfull thinking plays in creating fears excessive worry and persistent anxiety symptoms  [name], [facility]

## 2019-10-19 ENCOUNTER — Ambulatory Visit: Payer: BC Managed Care – PPO | Admitting: Psychiatry

## 2019-10-22 ENCOUNTER — Other Ambulatory Visit: Payer: Self-pay

## 2019-10-22 ENCOUNTER — Encounter: Payer: Self-pay | Admitting: Neurology

## 2019-10-22 ENCOUNTER — Ambulatory Visit: Payer: BC Managed Care – PPO | Admitting: Neurology

## 2019-10-22 VITALS — BP 124/72 | HR 76 | Temp 97.7°F | Ht 64.5 in | Wt 115.0 lb

## 2019-10-22 DIAGNOSIS — F411 Generalized anxiety disorder: Secondary | ICD-10-CM

## 2019-10-22 DIAGNOSIS — F5104 Psychophysiologic insomnia: Secondary | ICD-10-CM | POA: Diagnosis not present

## 2019-10-22 DIAGNOSIS — F4001 Agoraphobia with panic disorder: Secondary | ICD-10-CM | POA: Diagnosis not present

## 2019-10-22 DIAGNOSIS — R209 Unspecified disturbances of skin sensation: Secondary | ICD-10-CM

## 2019-10-22 MED ORDER — QUETIAPINE FUMARATE 25 MG PO TABS: 25.0000 mg | ORAL_TABLET | Freq: Every day | ORAL | 0 refills | Status: DC

## 2019-10-22 NOTE — Progress Notes (Signed)
SLEEP MEDICINE CLINIC    Provider:  Larey Seat, MD  Primary Care Physician:  Loraine Leriche., MD [place] [address]     Referring Provider: Hale Bogus, MD GYN        Chief Complaint according to patient   Patient presents with:    . New Patient (Initial Visit)           HISTORY OF PRESENT ILLNESS:  Mariah [name] is a 59 y.o. year old Caucasian female patient seen on 10/22/2019 in a referral from Hale Bogus, MD  for chronic insomnia, in the setting of years of poor sleep. She has seen Dr. Felecia Shelling, MD for tingling in the left foot.     I have the pleasure of seeing Mariah [name] today, a right -handed White or Caucasian female with a possible sleep disorder.  She   has a past medical history of Anxiety, Osteopenia, and Vision abnormalities.  59 y.o. [id number] Married  Caucasian female went into menopause about 8 years ago.  Has been on HRT with a patch and had break through bleeding with the past.   She stopped this.  The second time, she had an endometrial polyp and she had to have a procedure.  She is not taking HRT.  She really struggles with sleep.  This changed with menopause but this has gotten worse.  Doesn't have hot flashes.  She does have some cold intolerance.  Doesn't have night sweats.  Does have increased anxiety since the last few years.  Has been seeing a therapist for years.  Does yoga and exercise and this helps a lot during the day.   When she is running she reports feeling foggy in her mind, this happens whenever she does cardio exercises. After a massage she feels foggy.   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   Family medical /sleep history: negative for insomnia, sleep walkers.    Social history: Patient is retired from Facilities manager care and lives in a household with husband and daughter Pets are  Present., 1 cat. Tobacco use; quit 35 year ago .   ETOH use : none , Caffeine intake in  form of Coffee( none ) Soda ( none) Tea ( none), nor energy drinks. Regular exercise in form of - yoga, hiking, paddle boarding.    Hobbies :sports   Sleep habits are as follows: The patient's dinner time is between 6- 6.30 PM.  Husband snores- she is bothered by this. The patient goes to bed at 10-11 PM and continues to struggle to sleep for several hours, wakes for 1-2 bathroom breaks, this started with menopause.  The preferred sleep position is lateral, with the support of 1 pillow.  Dreams are reportedly rare.  7 AM is the usual rise time. The patient wakes up spontaneously 4-5 AM with an alarm.   She reports not feeling refreshed or restored in AM, with symptoms such as dry mouth , feeling racing thought , racing heart/, and residual fatigue.    Review of Systems: Out of a complete 14 system review, the patient complains of only the following symptoms, and all other reviewed systems are negative.:  Fatigue, sleepiness , a husband witnessed snoring, fragmented sleep, Insomnia = for years.    How likely are you to doze in the following situations: 0 = not likely, 1 = slight chance, 2 = moderate chance, 3 = high  chance   Sitting and Reading? Watching Television? Sitting inactive in a public place (theater or meeting)? As a passenger in a car for an hour without a break? Lying down in the afternoon when circumstances permit? Sitting and talking to someone? Sitting quietly after lunch without alcohol? In a car, while stopped for a few minutes in traffic?   Total = 10/ 24 points   FSS endorsed at 39/ 63 points.   Social History   Socioeconomic History  . Marital status: Married    Spouse name: Not on file  . Number of children: Not on file  . Years of education: Not on file  . Highest education level: Not on file  Occupational History  . Not on file  Tobacco Use  . Smoking status: Former Smoker    Quit date: 10/02/1983    Years since quitting: 36.0  . Smokeless tobacco:  Never Used  Substance and Sexual Activity  . Alcohol use: No  . Drug use: No  . Sexual activity: Yes    Comment: Vasectomy  Other Topics Concern  . Not on file  Social History Narrative  . Not on file   Social Determinants of Health   Financial Resource Strain:   . Difficulty of Paying Living Expenses: Not on file  Food Insecurity:   . Worried About Charity fundraiser in the Last Year: Not on file  . Ran Out of Food in the Last Year: Not on file  Transportation Needs:   . Lack of Transportation (Medical): Not on file  . Lack of Transportation (Non-Medical): Not on file  Physical Activity:   . Days of Exercise per Week: Not on file  . Minutes of Exercise per Session: Not on file  Stress:   . Feeling of Stress : Not on file  Social Connections:   . Frequency of Communication with Friends and Family: Not on file  . Frequency of Social Gatherings with Friends and Family: Not on file  . Attends Religious Services: Not on file  . Active Member of Clubs or Organizations: Not on file  . Attends Archivist Meetings: Not on file  . Marital Status: Not on file    Family History  Problem Relation Age of Onset  . Hypertension Mother   . Skin cancer Mother   . Melanoma Father   . Multiple myeloma Father   . Anxiety disorder Daughter   . Depression Son     Past Medical History:  Diagnosis Date  . Anxiety   . Osteopenia   . Vision abnormalities     Past Surgical History:  Procedure Laterality Date  . DILATION AND CURETTAGE OF UTERUS       Current Outpatient Medications on File Prior to Visit  Medication Sig Dispense Refill  . Ascorbic Acid (VITA-C PO) Take by mouth.    Marland Kitchen CALCIUM PO Take by mouth.    . estradiol (ESTRACE VAGINAL) 0.1 MG/GM vaginal cream 1 gram pv twice weeky 42.5 g 4  . LORazepam (ATIVAN) 0.5 MG tablet Take 1/2-1 tab po TID prn anxiety and insomnia 90 tablet 0  . Multiple Vitamin (ONE DAILY) tablet Take by mouth.    . Multiple  Vitamins-Minerals (ZINC PO) Take by mouth.    . Omega-3 Fatty Acids (FISH OIL PO) Take by mouth.    . Theanine 50 MG TBDP Take by mouth.    . TURMERIC PO Take by mouth.    Marland Kitchen VITAMIN D PO Take by mouth.  No current facility-administered medications on file prior to visit.   Physical exam:  Today's Vitals   10/22/19 1043  BP: 124/72  Pulse: 76  Temp: 97.7 F (36.5 C)  Weight: 115 lb (52.2 kg)  Height: 5' 4.5" (1.638 m)   Body mass index is 19.43 kg/m.   Wt Readings from Last 3 Encounters:  10/22/19 115 lb (52.2 kg)  10/13/19 113 lb 12.8 oz (51.6 kg)  02/09/15 119 lb (54 kg)     Ht Readings from Last 3 Encounters:  10/22/19 5' 4.5" (1.638 m)  10/13/19 5' 4.5" (1.638 m)  02/09/15 '5\' 5"'  (1.651 m)      General: The patient is awake, alert and appears not in acute distress. The patient is well groomed. Head: Normocephalic, atraumatic. Neck is supple. Mallampati: 1- but the airway is short-   neck circumference:12 inches . Nasal airflow patent.  Retrognathia is mild.  Dental status: crowded teeth Cardiovascular:  Regular rate and cardiac rhythm by pulse,  without distended neck veins. Respiratory: Lungs are clear to auscultation.  Skin:  Without evidence of ankle edema, or rash. Trunk: The patient's posture is erect.   Neurologic exam : The patient is awake and alert, oriented to place and time.   Memory subjective described as intact.  Attention span & concentration ability appears normal.  Speech is fluent,  without  dysarthria, dysphonia or aphasia.  Mood and affect are very anxious, tense, and "jumpy".    Cranial nerves: no loss of smell or taste reported  Pupils are equal and briskly reactive to light. Funduscopic exam deferred.  Extraocular movements in vertical and horizontal planes were intact and without nystagmus. No Diplopia. Visual fields by finger perimetry are intact. Hearing was intact to soft voice and finger rubbing.    Facial sensation intact to  fine touch.  She had seen neurology for left sided eye pain, face tinging.  Facial motor strength is symmetric and tongue and uvula move midline.  Neck ROM : rotation, tilt and flexion extension were normal for age and shoulder shrug was symmetrical.    Motor exam:  Symmetric bulk, tone and ROM.   Normal tone without cog wheeling, symmetric grip strength .   Sensory:  Fine touch, pinprick and vibration were tested- the patient reports left sided  numbness but no consistently though. Proprioception tested in the upper extremities was normal.   Coordination: Rapid alternating movements in the fingers/hands were of normal speed.  The Finger-to-nose maneuver was intact without evidence of ataxia, dysmetria or tremor.   Gait and station: Patient could rise unassisted from a seated position, walked without assistive device.  Toe and heel walk were deferred.  Deep tendon reflexes: in the upper and lower extremities are very, very brisk- symmetric - .  Babinski response was deferred.     The patient has tried Ambien, Belsomra, Melatonin IR.  Most recently Ativan - Lorazepam. Psychiatrist [facility], [name], NP .  Tried also Lexapro, Zoloft.  Has not tried Seroquel.     After spending a total time of 40 minutes face to face and additional time for physical and neurologic examination, review of laboratory studies,  personal review of imaging studies, reports and results of other testing and review of referral information / records as far as provided in visit, I have established the following assessments:  1)  In order to rule out any organic sleep disorder, I like to obtain an attended sleep study- she is snoring.  2)  I believe  this chronic insomnia is anxiety and panic disorder related.  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    My Plan is to proceed with:  1)   Attended sleep study, preferred over HST.  2)    I would like to suggest Seroquel 25 mg at night for sleep induction. This patient is in therapy for 5 years, following a ' anxiety attack" . 3)   The hyperreflexia can be related to mental stress. I reviewed Ct and MRI from hospital stay.   I would like to thank Loraine Leriche., MD  for allowing me to meet with this pleasant patient.   In short, Mariah [name] is presenting with chronic insomnia and hyperreflexia. I plan to follow up either personally or through our NP within 2-3 month.   CC: I will share my notes with PCP.   Electronically signed by: Larey Seat, MD 10/22/2019 11:07 AM  [facility] Board certified by The [employer] of Sleep Medicine and Diplomate of the [employer] of Sleep Medicine. Board certified In Neurology through the [employer], Fellow of the [employer] of Neurology. Medical Director of [employer].

## 2019-10-22 NOTE — Patient Instructions (Signed)
Quetiapine tablets What is this medicine? QUETIAPINE (kwe TYE a peen) is an antipsychotic. It is used to treat schizophrenia and bipolar disorder, also known as manic-depression. This medicine may be used for other purposes; ask your health care provider or pharmacist if you have questions. COMMON BRAND NAME(S): Seroquel What should I tell my health care provider before I take this medicine? They need to know if you have any of these conditions:  blockage in your bowel  cataracts  constipation  dehydration  diabetes  difficulty swallowing  glaucoma  heart disease  history of breast cancer  kidney disease  liver disease  low blood counts, like low white cell, platelet, or red cell counts  low blood pressure or dizziness when standing up  Parkinson's disease  previous heart attack  prostate disease  seizures  stomach or intestine problems  suicidal thoughts, plans or attempt; a previous suicide attempt by you or a family member  thyroid disease  trouble passing urine  an unusual or allergic reaction to quetiapine, other medicines, foods, dyes, or preservatives  pregnant or trying to get pregnant  breast-feeding How should I use this medicine? Take this medicine by mouth. Swallow it with a drink of water. Follow the directions on the prescription label. If it upsets your stomach you can take it with food. Take your medicine at regular intervals. Do not take it more often than directed. Do not stop taking except on the advice of your doctor or health care professional. A special MedGuide will be given to you by the pharmacist with each prescription and refill. Be sure to read this information carefully each time. Talk to your pediatrician regarding the use of this medicine in children. While this drug may be prescribed for children as young as 10 years for selected conditions, precautions do apply. Patients over age 65 years may have a stronger reaction to this  medicine and need smaller doses. Overdosage: If you think you have taken too much of this medicine contact a poison control center or emergency room at once. NOTE: This medicine is only for you. Do not share this medicine with others. What if I miss a dose? If you miss a dose, take it as soon as you can. If it is almost time for your next dose, take only that dose. Do not take double or extra doses. What may interact with this medicine? Do not take this medicine with any of the following medications:  cisapride  dronedarone  fluconazole  metoclopramide  pimozide  posaconazole  thioridazine This medicine may also interact with the following medications:  alcohol  antihistamines for allergy cough and cold  antiviral medicines for HIV or AIDS  atropine  certain medicines for bladder problems like oxybutynin, tolterodine  certain medicines for blood pressure  certain medicines for depression, anxiety, or psychotic disturbances  certain medicines for diabetes  certain medicines for stomach problems like dicyclomine, hyoscyamine  certain medicines for travel sickness like scopolamine  certain medicines for Parkinson's disease  certain medicines for seizures like carbamazepine, phenobarbital, phenytoin  cimetidine  erythromycin  ipratropium  other medicines that prolong the QT interval (cause an abnormal heart rhythm) like dofetilide  rifampin  steroid medicines like prednisone or cortisone This list may not describe all possible interactions. Give your health care provider a list of all the medicines, herbs, non-prescription drugs, or dietary supplements you use. Also tell them if you smoke, drink alcohol, or use illegal drugs. Some items may interact with your medicine. What   should I watch for while using this medicine? Visit your health care professional for regular checks on your progress. Tell your health care professional if symptoms do not start to get  better or if they get worse. Do not stop taking except on your health care professional's advice. You may develop a severe reaction. Your health care professional will tell you how much medicine to take. You may need to have an eye exam before and during use of this medicine. This medicine may increase blood sugar. Ask your health care provider if changes in diet or medicines are needed if you have diabetes. Patients and their families should watch out for new or worsening depression or thoughts of suicide. Also watch out for sudden or severe changes in feelings such as feeling anxious, agitated, panicky, irritable, hostile, aggressive, impulsive, severely restless, overly excited and hyperactive, or not being able to sleep. If this happens, especially at the beginning of antidepressant treatment or after a change in dose, call your health care professional. You may get dizzy or drowsy. Do not drive, use machinery, or do anything that needs mental alertness until you know how this medicine affects you. Do not stand or sit up quickly, especially if you are an older patient. This reduces the risk of dizzy or fainting spells. Alcohol may interfere with the effect of this medicine. Avoid alcoholic drinks. This drug can cause problems with controlling your body temperature. It can lower the response of your body to cold temperatures. If possible, stay indoors during cold weather. If you must go outdoors, wear warm clothes. It can also lower the response of your body to heat. Do not overheat. Do not over-exercise. Stay out of the sun when possible. If you must be in the sun, wear cool clothing. Drink plenty of water. If you have trouble controlling your body temperature, call your health care provider right away. What side effects may I notice from receiving this medicine? Side effects that you should report to your doctor or health care professional as soon as possible:  allergic reactions like skin rash,  itching or hives, swelling of the face, lips, or tongue  breathing problems  changes in vision  confusion  elevated mood, decreased need for sleep, racing thoughts, impulsive behavior  eye pain  fast, irregular heartbeat  fever or chills, sore throat  inability to keep still  males: prolonged or painful erection  problems with balance, talking, walking  redness, blistering, peeling, or loosening of the skin, including inside the mouth  seizures  signs and symptoms of high blood sugar such as being more thirsty or hungry or having to urinate more than normal. You may also feel very tired or have blurry vision  signs and symptoms of hypothyroidism like fatigue; increased sensitivity to cold; weight gain; hoarseness; thinning hair  signs and symptoms of low blood pressure like dizziness; feeling faint or lightheaded; falls; unusually weak or tired  signs and symptoms of neuroleptic malignant syndrome like confusion; fast, irregular heartbeat; high fever; increased sweating; stiff muscles  sudden numbness or weakness of the face, arm, or leg  suicidal thoughts or other mood changes  trouble swallowing  uncontrollable movements of the arms, face, head, mouth, neck, or upper body Side effects that usually do not require medical attention (report to your doctor or health care professional if they continue or are bothersome):  change in sex drive or performance  constipation  drowsiness  dry mouth  upset stomach  weight gain This list may   not describe all possible side effects. Call your doctor for medical advice about side effects. You may report side effects to FDA at 1-800-FDA-1088. Where should I keep my medicine? Keep out of the reach of children. Store at room temperature between 15 and 30 degrees C (59 and 86 degrees F). Throw away any unused medicine after the expiration date. NOTE: This sheet is a summary. It may not cover all possible information. If you  have questions about this medicine, talk to your doctor, pharmacist, or health care provider.  2020 Elsevier/Gold Standard (2019-07-15 11:59:11)  

## 2019-11-02 ENCOUNTER — Ambulatory Visit (INDEPENDENT_AMBULATORY_CARE_PROVIDER_SITE_OTHER): Payer: BC Managed Care – PPO | Admitting: Psychiatry

## 2019-11-02 ENCOUNTER — Other Ambulatory Visit: Payer: Self-pay

## 2019-11-02 DIAGNOSIS — F411 Generalized anxiety disorder: Secondary | ICD-10-CM | POA: Diagnosis not present

## 2019-11-02 NOTE — Progress Notes (Signed)
[facility] Counselor/Therapist Progress Note  Patient ID: Mariah [name], MRN: [id number],    Date: 11/02/2019  Time Spent:  start time 3:03 PM end time 3:53 PM  Treatment Type: Individual Therapy  Reported Symptoms: anxiety, sleep issues  Mental Status Exam:  Appearance:   Well Groomed     Behavior:  Appropriate  Motor:  Normal  Speech/Language:   Normal Rate  Affect:  Appropriate  Mood:  anxious  Thought process:  normal  Thought content:    Rumination  Sensory/Perceptual disturbances:    WNL  Orientation:  oriented to person, place, time/date and situation  Attention:  Good  Concentration:  Good  Memory:  WNL  Fund of knowledge:   Good  Insight:    Good  Judgment:   Good  Impulse Control:  Good   Risk Assessment: Danger to Self:  No Self-injurious Behavior: No Danger to Others: No Duty to Warn:no Physical Aggression / Violence:No  Access to Firearms a concern: No  Gang Involvement:No   Subjective: Patient was present for session.  She is going to have sleep study done in her neurologist office.  She stated she is taking time release Melatonin and it is helping.  Patient reported that her biggest stressor currently is having her daughter at her house.  She realizes it is better having her there because she does not have to worry about her as much.  Patient went on to share that she is also concerned about her husband who is continuing to drink and she knows that he is drinking even though he is trying to hide things from her.  Patient shared that she did confront him and talk to him about it and encouraged him just to be honest with what is going on rather than trying to hide things and making it worse.  Patient shared she is concerned about his health is much as anything.  She knows all the stress with his mother his job and their son getting ready to go [place] is a lot on him and she is concerned he will have a heart attack.  Ways for her  to continue to communicate her concerns with her husband appropriately were discussed in session.  Patient was encouraged to remind herself of the box theory and focus on the things that she can control and to try and let go of things that occur with him.  The importance of keeping herself engaged in positive activity and exercising with her peers was discussed with patient.  Patient was also encouraged to continue doing what she has to do to feel she is safe during the situation.  Interventions: Cognitive Behavioral Therapy and Solution-Oriented/Positive Psychology  Diagnosis:   ICD-10-CM   1. Generalized anxiety disorder  F41.1     Plan: Patient is to continue utilizing CBT and coping skills to decrease anxiety symptoms.  Patient is to exercise and get out with friends to help decrease anxiety.  Patient is to focus on the things she can control fix and change and let go of her husband's behaviors. Long-term goal: Enhance ability to handle effectively the full variety of life's anxieties Short-term goal: Identify the major life complex from the past and present the form the basis for present anxiety Verbalize an understanding of the role that fearful thinking plays in creating fears excessive worries and persistent anxiety symptoms [name], [facility]

## 2019-11-14 IMAGING — MG DIGITAL SCREENING BILATERAL MAMMOGRAM WITH TOMO AND CAD
6 of 10 series · 6 of 30 positions shown · non-contrast
Comparison: Previous exam(s).

CLINICAL DATA: Screening.

EXAM:
DIGITAL SCREENING BILATERAL MAMMOGRAM WITH TOMO AND CAD

[L MLO synth-2D]
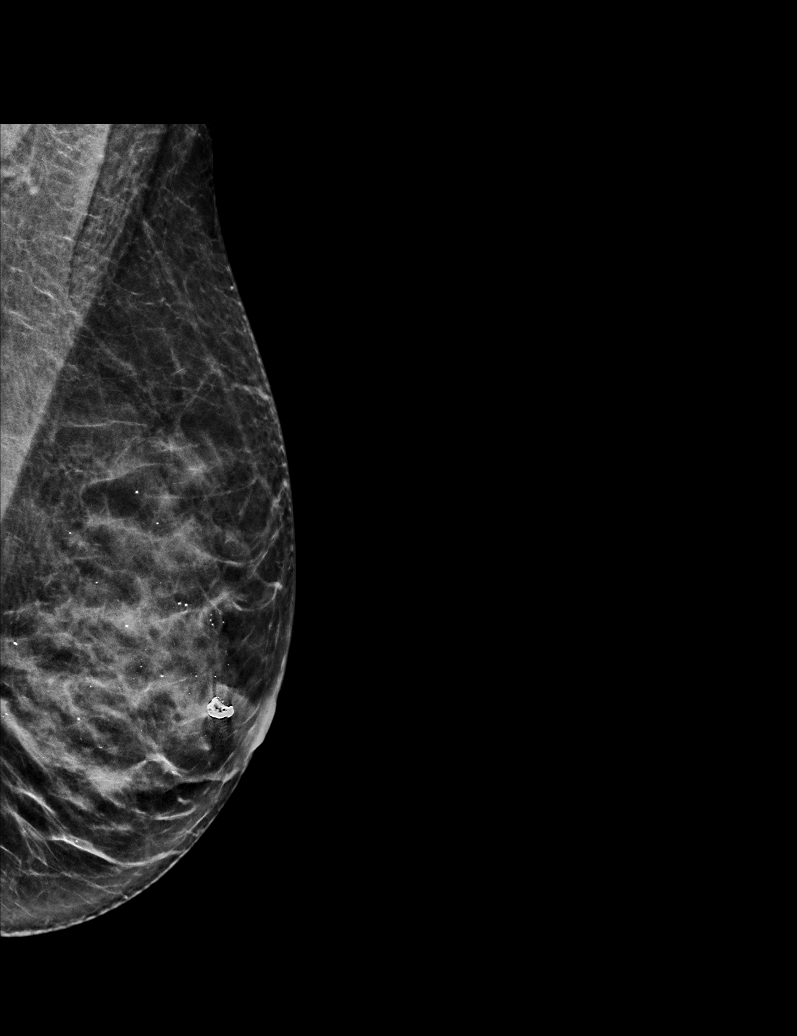

[R CC synth-2D]
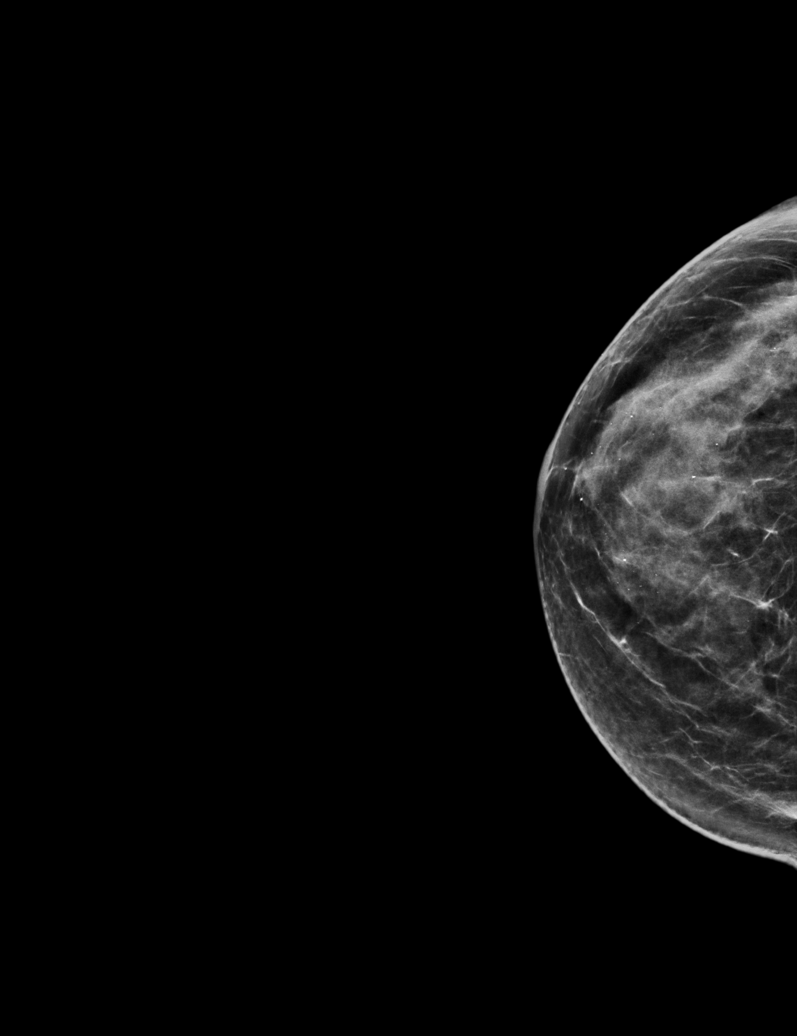

[R MLO synth-2D]
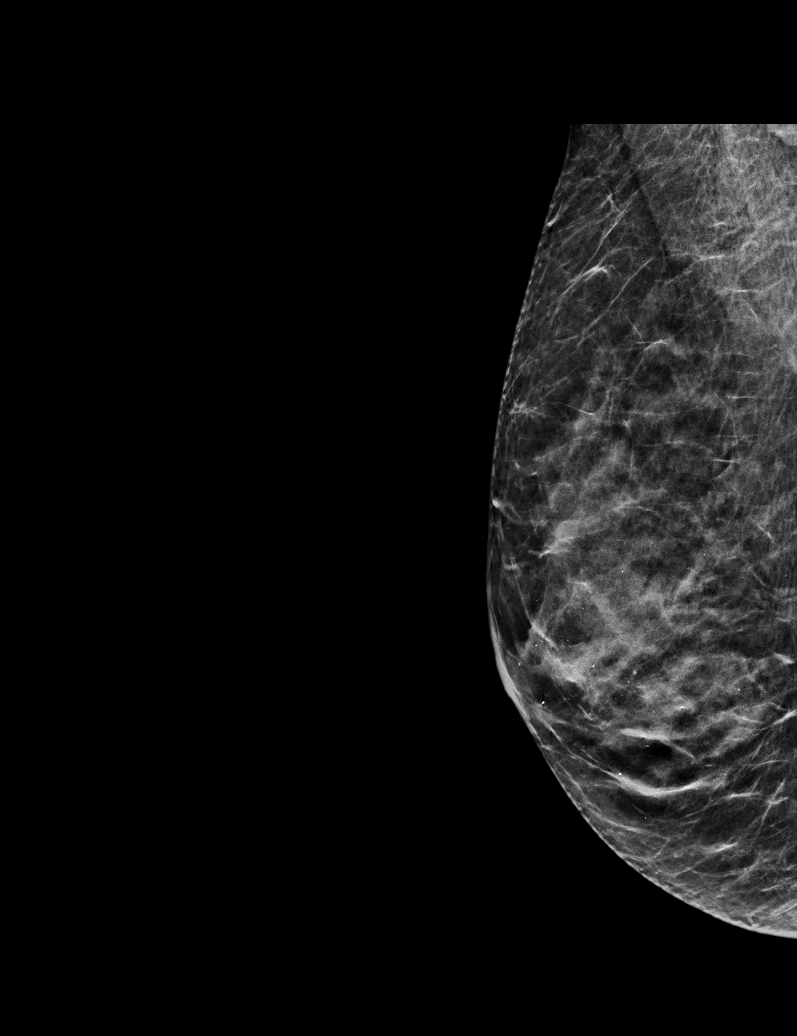

[R XCCL synth-2D]
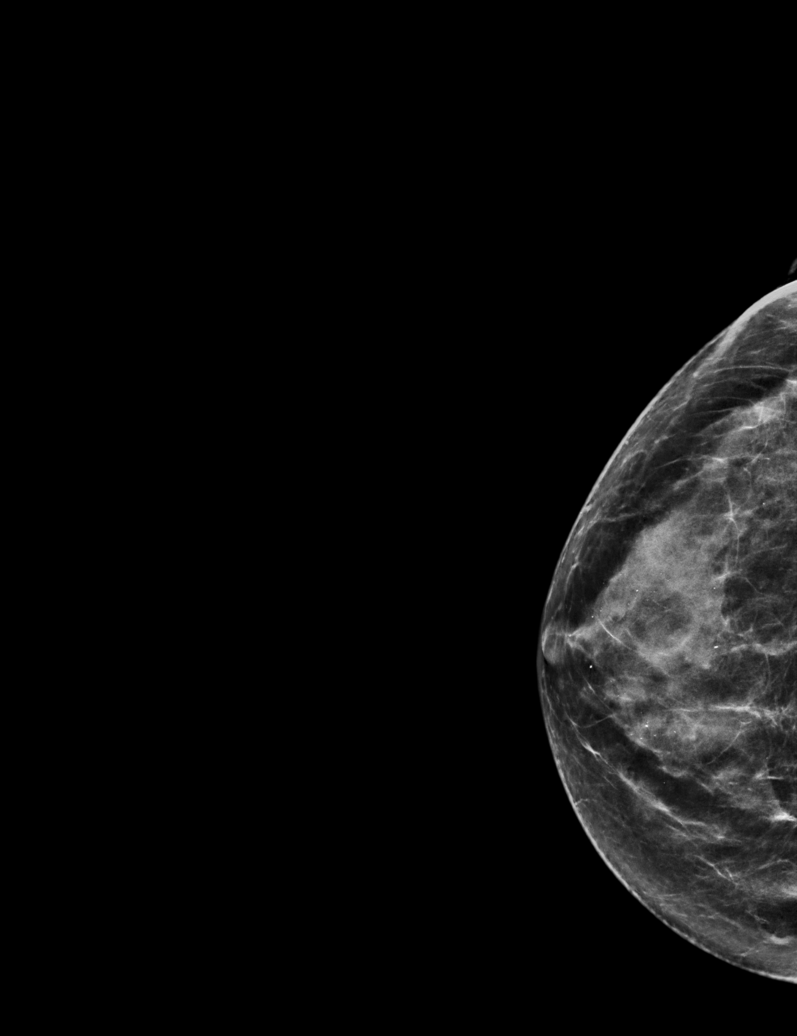

[L CC synth-2D]
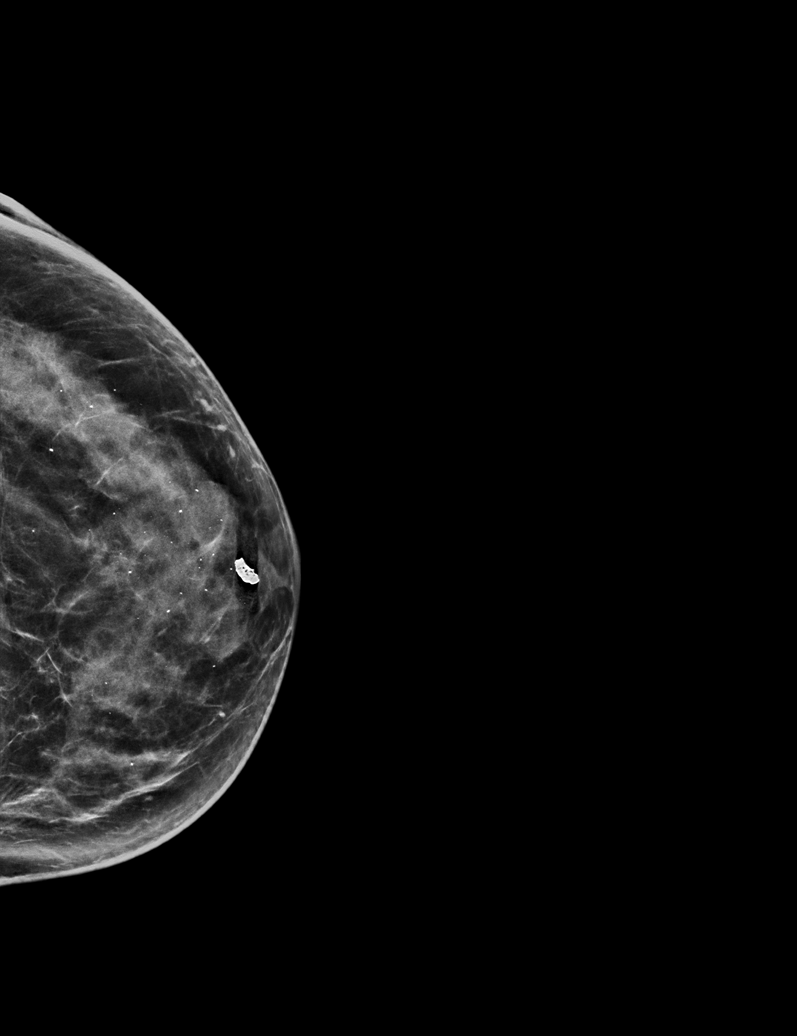

[R CC tomo · tomo slice 27/54.0]
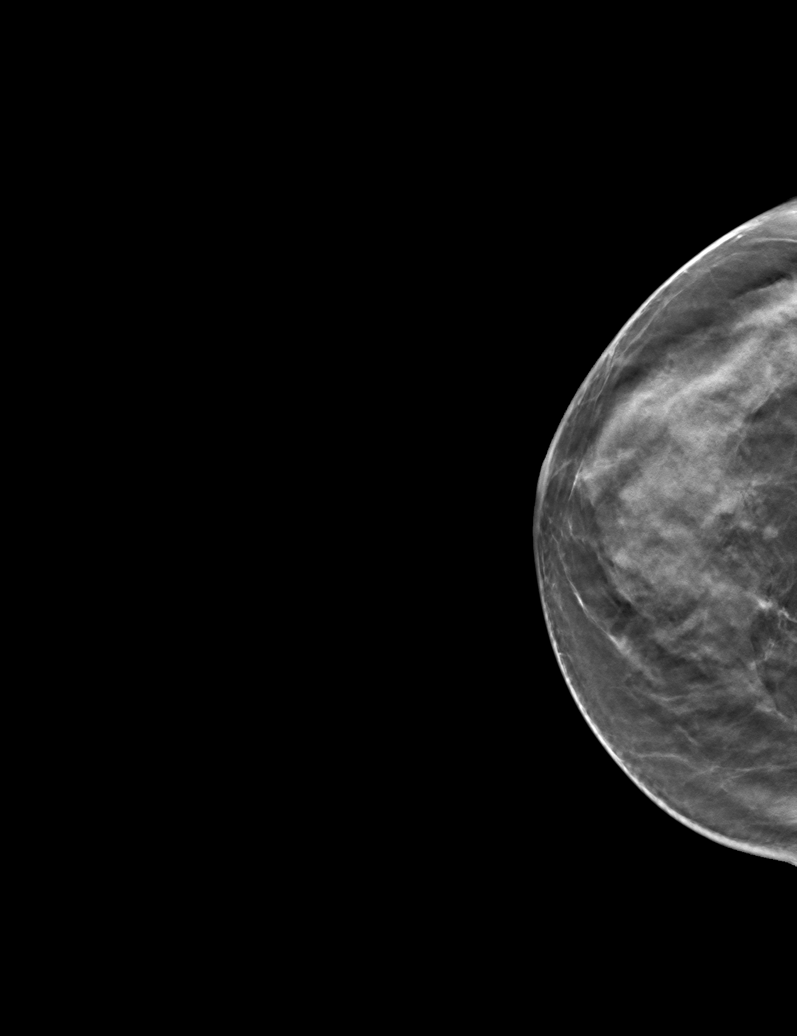

[6 of 30 positions shown; findings below may reference images not displayed]

ACR Breast Density Category c: The breast tissue is heterogeneously
dense, which may obscure small masses.
FINDINGS: There are no findings suspicious for malignancy. Images were
processed with CAD.
IMPRESSION: No mammographic evidence of malignancy. A result letter of this
screening mammogram will be mailed directly to the patient.

RECOMMENDATION:
Screening mammogram in one year. (Code:[id number])

BI-RADS CATEGORY  1: Negative.

## 2019-11-24 ENCOUNTER — Other Ambulatory Visit: Payer: Self-pay | Admitting: Neurology

## 2019-12-01 ENCOUNTER — Ambulatory Visit (INDEPENDENT_AMBULATORY_CARE_PROVIDER_SITE_OTHER): Payer: BC Managed Care – PPO | Admitting: Psychiatry

## 2019-12-01 ENCOUNTER — Other Ambulatory Visit: Payer: Self-pay

## 2019-12-01 DIAGNOSIS — F411 Generalized anxiety disorder: Secondary | ICD-10-CM | POA: Diagnosis not present

## 2019-12-01 NOTE — Progress Notes (Signed)
      [facility] Counselor/Therapist Progress Note  Patient ID: Mariah [name], MRN: [id number],    Date: 12/01/2019  Time Spent: 50 minutes start time 1:10 PM end time 2 PM  Treatment Type: Individual Therapy  Reported Symptoms: anxiety, sleep issues  Mental Status Exam:  Appearance:   Casual     Behavior:  Sharing  Motor:  Normal  Speech/Language:   Normal Rate  Affect:  Appropriate  Mood:  anxious  Thought process:  normal  Thought content:    Rumination  Sensory/Perceptual disturbances:    WNL  Orientation:  oriented to person, place, time/date and situation  Attention:  Good  Concentration:  Good  Memory:  WNL  Fund of knowledge:   Good  Insight:    Good  Judgment:   Good  Impulse Control:  Good   Risk Assessment: Danger to Self:  No Self-injurious Behavior: No Danger to Others: No Duty to Warn:no Physical Aggression / Violence:No  Access to Firearms a concern: No  Gang Involvement:No   Subjective: Patient was present for session.  She shared it was a hard month but things are getting better with the change in the weather. She is still struggling with her daughter.  She shared that her daughter is accomplished a lot with her work but yet is still struggling with feeling like she is enough.  Discussed the importance of her communicating with her daughter her concerns and finding ways to empower her that she feels positive about.  Patient also shared that she is concerned about her son who is not going on his trip on the [place] trail and she knows that he is not doing well.  Patient went on to share that her husband is currently having to wear a heart monitor and she feels like it is probably stress related but she is not sure.  Patient was encouraged to focus on the things that she can control fix and change.  She reports that she tries to remind herself on that a regular basis.  She is also focusing on her self-care by trying to find ways to volunteer during COVID and  is exercising regularly with friends.  Interventions: Cognitive Behavioral Therapy and Solution-Oriented/Positive Psychology  Diagnosis:   ICD-10-CM   1. Generalized anxiety disorder  F41.1     Plan: She is to use CBT and coping skills to decrease anxiety symptoms.  Patient is to continue trying to find ways to work on self-care.  She is continue walking with her friends on a regular basis and reminding herself that she has to focus on the things she can control fix and change.  Patient is to ask her daughter what she needs to do to help her affirm herself and feel better. Long-term goal: Enhance ability to handle effectively a variety of life's anxieties Short Term goal: Identify the major life complex from the past and present the form the basis for present anxiety Verbalize understanding of the role that fearful thinking plays in creating fears excessive worries and persistent anxiety symptoms  [name], [facility]

## 2019-12-06 ENCOUNTER — Ambulatory Visit (INDEPENDENT_AMBULATORY_CARE_PROVIDER_SITE_OTHER): Payer: BC Managed Care – PPO | Admitting: Neurology

## 2019-12-06 DIAGNOSIS — G4710 Hypersomnia, unspecified: Secondary | ICD-10-CM | POA: Diagnosis not present

## 2019-12-06 DIAGNOSIS — R209 Unspecified disturbances of skin sensation: Secondary | ICD-10-CM

## 2019-12-06 DIAGNOSIS — F4001 Agoraphobia with panic disorder: Secondary | ICD-10-CM

## 2019-12-06 DIAGNOSIS — F5104 Psychophysiologic insomnia: Secondary | ICD-10-CM

## 2019-12-06 DIAGNOSIS — F411 Generalized anxiety disorder: Secondary | ICD-10-CM

## 2019-12-18 NOTE — Progress Notes (Signed)
IMPRESSION:   1. Insomnia: Very fragmented sleep and restless periods of  wakefulness, early morning arousal. The arousal followed REM  sleep.  2. No physiological causes for arousals out of sleep were noted.  There is normal breathing, oxygenation, heart rate and limb  relaxation. The patient entered slow wave sleep at 22.30 hours.  REM sleep at 23.25 hours. Each of the three REM sleep periods  ended in an arousal, waking up.  3. Consider anxiety, stress as causes of sleep fragmentation. If  the patient has tried counseling, cognitive behavior therapy, and  depression medication, Seroquel may be considered and discussed  with her psychologist/ psychiatrist. Residential treatment of  anxiety and panic disorders should be entertained.    RECOMMENDATIONS:   1. There were no physiological causes for the sleep interruption  identified.

## 2019-12-18 NOTE — Procedures (Signed)
PATIENT'S NAME:  Mariah [name], Mariah [name] DOB:      27-Dec-1960      MR#:    [id number]     DATE OF RECORDING: 3/7/2021AL REFERRING M.D.:  Nino Glow, MD Study Performed:   Expanded EEG Polysomnogram HISTORY:  59 y.o. year old Caucasian female patient seen on 10/22/2019 in a referral from Hale Bogus, MD, for chronic insomnia in the setting of years of poor sleep quality. She has seen Dr. Felecia Shelling, MD for tingling in the left foot.   Maelie Chriswell is a right -handed Caucasian female with a medical history of Anxiety, Osteopenia, and Vision abnormalities. Has been on HRT by patch and had breakthrough bleeding. She is not longer taking HRT.  She now really struggles with sleep, a condition that arose before menopause but has gotten worse. Doesn't have hot flashes.  Doesn't have night sweats.  Does have increased anxiety since the last few years. Has been seeing a therapist for years, participates in yoga and exercise and this helps a lot during the day.  When she is running, she reports feeling foggy in her mind, this happens whenever she does cardio exercises too. Even after a massage she reportedly feels foggy.  Her husband snores- she is bothered by this. The patient goes to bed at 10-11 PM and continues to struggle to sleep for several hours, wakes for 1-2 bathroom breaks, these started with menopause. The patient wakes up spontaneously at 4-5 AM. She reports not feeling refreshed / restored, with symptoms such as dry mouth and feeling her mind racing, her heart racing.  The patient endorsed the Epworth Sleepiness Scale at 10 points.   The patient's weight 115 pounds with a height of 64 (inches), resulting in a BMI of 19.6 kg/m2. The patient's neck circumference measured 12 inches.  CURRENT MEDICATIONS: Estradiol, Ativan   PROCEDURE:  This is a multichannel digital polysomnogram utilizing the Somnostar 11.2 system.  Electrodes and sensors were applied and monitored per AASM Specifications.   EEG, EOG, Chin and  Limb EMG, were sampled at 200 Hz.  ECG, Snore and Nasal Pressure, Thermal Airflow, Respiratory Effort, CPAP Flow and Pressure, Oximetry was sampled at 50 Hz. Digital video and audio were recorded.      BASELINE STUDY: Lights Out was at 21:57 and Lights On at 04:48.  Total recording time (TRT) was 412 minutes, with a total sleep time (TST) of 211.5 minutes.   The patient's sleep latency was 27 minutes.  REM latency was 62.5 minutes.  The sleep efficiency was poor at  51.3 %.     SLEEP ARCHITECTURE: WASO (Wake after sleep onset) was 75.5 minutes.  There were 22.5 minutes in Stage N1, 64.5 minutes Stage N2, 89.5 minutes Stage N3 and 35 minutes in Stage REM.  The percentage of Stage N1 was 10.6%, Stage N2 was 30.5%, Stage N3 was 42.3% and Stage R (REM sleep) was 16.5%.   RESPIRATORY ANALYSIS:  There were a total of 0 respiratory events:  0 obstructive apneas, 0 central apneas and 0 mixed apneas with a total of 0 apneas and an apnea index (AI) of 0 /hour. There were 0 hypopneas with a hypopnea index of 0 /hour.      The total APNEA/HYPOPNEA INDEX (AHI) was 0/hour.  0 events occurred in REM sleep and 0 events in NREM. The REM AHI was  0 /hour, versus a non-REM AHI of 0. The patient spent her total sleep time in the non-supine position -212 . The supine AHI  was 0.0 versus a non-supine AHI of 0.0.  OXYGEN SATURATION & C02:  The Wake baseline 02 saturation was 98%, with the lowest being 94%. Time spent below 89% saturation equaled 0 minutes.  The arousals were noted as: 49 were spontaneous, 0 were associated with PLMs, 0 were associated with respiratory events. The patient had a total of 0 Periodic Limb Movements. The patient was very restless once aroused, but had slept calmly in between.   Audio and video analysis did not show any abnormal behaviors, phonations or vocalizations. EKG was in keeping with normal sinus rhythm (NSR).   IMPRESSION:  1. Insomnia: Very fragmented sleep and restless periods of  wakefulness, early morning arousal. The arousal followed REM sleep. 2. No physiological causes for arousals out of sleep were noted. There is normal breathing, oxygenation, heart rate and limb relaxation. The patient entered slow wave sleep at 22.30 hours. REM sleep at 23.25 hours. Each of the three REM sleep periods ended in an arousal, waking up.  3. Consider anxiety, stress as causes of sleep fragmentation. If the patient has tried counseling, cognitive behavior therapy, and depression medication, Seroquel may be considered and discussed with her psychologist/ psychiatrist. Residential treatment of anxiety and panic disorders should be entertained.   RECOMMENDATIONS:  1. There were no physiological causes for the sleep interruption identified.    I certify that I have reviewed the entire raw data recording prior to the issuance of this report in accordance with the Standards of Accreditation of the American Academy of Sleep Medicine (AASM)    Melvyn Novas, MD Diplomat, American Board of Psychiatry and Neurology  Diplomat, American Board of Sleep Medicine Wellsite geologist, [facility] [employer]

## 2019-12-22 ENCOUNTER — Telehealth: Payer: Self-pay | Admitting: Neurology

## 2019-12-22 ENCOUNTER — Encounter: Payer: Self-pay | Admitting: Neurology

## 2019-12-22 NOTE — Telephone Encounter (Signed)
-----   Message from Melvyn Novas, MD sent at 12/18/2019  5:13 PM EDT ----- IMPRESSION:   1. Insomnia: Very fragmented sleep and restless periods of  wakefulness, early morning arousal. The arousal followed REM  sleep.  2. No physiological causes for arousals out of sleep were noted.  There is normal breathing, oxygenation, heart rate and limb  relaxation. The patient entered slow wave sleep at 22.30 hours.  REM sleep at 23.25 hours. Each of the three REM sleep periods  ended in an arousal, waking up.  3. Consider anxiety, stress as causes of sleep fragmentation. If  the patient has tried counseling, cognitive behavior therapy, and  depression medication, Seroquel may be considered and discussed  with her psychologist/ psychiatrist. Residential treatment of  anxiety and panic disorders should be entertained.    RECOMMENDATIONS:   1. There were no physiological causes for the sleep interruption  identified.

## 2019-12-22 NOTE — Telephone Encounter (Signed)
Called patient to discuss sleep study results. No answer at this time. LVM for the patient to call back. I will also send a mychart message.   

## 2020-01-04 ENCOUNTER — Other Ambulatory Visit: Payer: Self-pay

## 2020-01-04 ENCOUNTER — Ambulatory Visit (INDEPENDENT_AMBULATORY_CARE_PROVIDER_SITE_OTHER): Payer: BC Managed Care – PPO | Admitting: Psychiatry

## 2020-01-04 DIAGNOSIS — F411 Generalized anxiety disorder: Secondary | ICD-10-CM | POA: Diagnosis not present

## 2020-01-04 NOTE — Progress Notes (Signed)
[facility] Counselor/Therapist Progress Note  Patient ID: Mariah [name], MRN: [id number],    Date: 01/04/2020  Time Spent: 52 minutes start time 1:03 PM end time 1:55 PM  Treatment Type: Individual Therapy  Reported Symptoms: fatigue, anxiety, sleep issues  Mental Status Exam:  Appearance:   Well Groomed     Behavior:  Appropriate  Motor:  Normal  Speech/Language:   Normal Rate  Affect:  Appropriate and Tearful  Mood:  sad  Thought process:  normal  Thought content:    WNL  Sensory/Perceptual disturbances:    WNL  Orientation:  oriented to person, place, time/date and situation  Attention:  Good  Concentration:  Good  Memory:  WNL  Fund of knowledge:   Good  Insight:    Good  Judgment:   Good  Impulse Control:  Good   Risk Assessment: Danger to Self:  No Self-injurious Behavior: No Danger to Others: No Duty to Warn:no Physical Aggression / Violence:No  Access to Firearms a concern: No  Gang Involvement:No   Subjective: Patient was present for session.  She shared she is feeling very weighed down by the family situation. Her son had come into town and it was okay.  He has moved in with a friend and she is realizing she has to let him figure out what he is going to do.  Patient acknowledged that it is easier for her to let go of her son with him at a state that she is struggling with watching her daughter struggle with her depression.  She explained things continue to be very difficult with him she and her son are at the house.  She is also struggling with her husband's behavior and is let him know that she needs some couples therapy with him if there is going to be a future for their marriage.  Patient shared she is not hopeful that he will follow through but is trying to recognize she cannot control his behaviors.  Patient reported she is continuing to spend lots of time with her friends and she is exercising and doing her Bible studies which has been grounding for her.   She also had her sleep study completed and showed that she has insomnia and anxiety is keeping her from resting as well as she should be.  She did talk to her husband about the fact that she is going to have to sleep in another bedroom because the snoring keeps her up and stresses her.  He agreed to that situation.  What she was reminded of her CBT skills and the importance of her to use her filters when she interacts with her husband and her children and to continue focusing on the things that she can control fix and change and get through the other things out of her box.  She was also encouraged to write down the positive things and try and focus on them and to remind herself that things can still work out in a positive manner.  Interventions: Cognitive Behavioral Therapy and Solution-Oriented/Positive Psychology  Diagnosis:   ICD-10-CM   1. Generalized anxiety disorder  F41.1     Plan: Patient is to utilize CBT and coping skills to decrease anxiety symptoms.  Patient is to sleep in another bedroom that her husband to try and at least have a more quiet room that she can sleep in more peacefully.  Patient is to continue reminding herself to focus on the things that she can control fix and  change and let go the other things. Long-term goal: Enhance ability to handle effectively the full variety of life's anxieties Short-term goal: Identify the major life complex in the past and present in the form the basis for present anxiety Verbalize an understanding of the role that fearful thinking plays in creating fears excessive worries and persistent anxiety symptoms  [name], [facility]

## 2020-01-14 ENCOUNTER — Ambulatory Visit: Payer: Self-pay | Admitting: Obstetrics & Gynecology

## 2020-01-25 ENCOUNTER — Other Ambulatory Visit (HOSPITAL_BASED_OUTPATIENT_CLINIC_OR_DEPARTMENT_OTHER): Payer: Self-pay | Admitting: Internal Medicine

## 2020-01-25 ENCOUNTER — Other Ambulatory Visit (HOSPITAL_BASED_OUTPATIENT_CLINIC_OR_DEPARTMENT_OTHER): Payer: Self-pay | Admitting: Obstetrics & Gynecology

## 2020-01-25 DIAGNOSIS — Z1231 Encounter for screening mammogram for malignant neoplasm of breast: Secondary | ICD-10-CM

## 2020-02-01 ENCOUNTER — Ambulatory Visit (INDEPENDENT_AMBULATORY_CARE_PROVIDER_SITE_OTHER): Payer: BC Managed Care – PPO | Admitting: Psychiatry

## 2020-02-01 ENCOUNTER — Other Ambulatory Visit: Payer: Self-pay

## 2020-02-01 DIAGNOSIS — F411 Generalized anxiety disorder: Secondary | ICD-10-CM | POA: Diagnosis not present

## 2020-02-01 NOTE — Progress Notes (Signed)
[facility] Counselor/Therapist Progress Note  Patient ID: Mariah [name], MRN: [id number],    Date: 02/01/2020  Time Spent: 50 minutes start time 10:03 AM end time 10:53 AM  Treatment Type: Individual Therapy  Reported Symptoms: anxiety, sleep issues, sadness  Mental Status Exam:  Appearance:   Well Groomed     Behavior:  Appropriate  Motor:  Normal  Speech/Language:   Normal Rate  Affect:  Appropriate  Mood:  normal  Thought process:  normal  Thought content:    WNL  Sensory/Perceptual disturbances:    WNL  Orientation:  oriented to person, place, time/date and situation  Attention:  Good  Concentration:  Good  Memory:  WNL  Fund of knowledge:   Good  Insight:    Good  Judgment:   Good  Impulse Control:  Good   Risk Assessment: Danger to Self:  No Self-injurious Behavior: No Danger to Others: No Duty to Warn:no Physical Aggression / Violence:No  Access to Firearms a concern: No  Gang Involvement:No   Subjective: Patient was present for session.  She has been gone a lot recently.  She shared being away has been good for her.  She is recognizing she is doing better when she is away from the house.  She shared that things are bad with her mother  In law who is 60.  She explained that her husband doesn't want to follow her suggestions so she was encouraged to just allow him to make his own choices. She is realizing her husband is comfortable in what he knows and if he doesn't want to work on the relationship she has to focus in herself.  Patient shared different things that she is wanting to do and ways that she can engage herself in positive activity.  Patient acknowledged that she would like for things to be different in her relationship but recognizes her husband can only do what he is wanting to do at this time.  Patient was able to remind herself to that seasonal issues make it worse for her family so she is going to have to remember that in the future and not get so  concerned about her children's moods during the winter months.  Patient explained that her son has things he is looking forward to and that is improving his mood.  Also her daughter is very engaged in her job right now and getting out and doing more positive things and that is improved her mood.  Patient shared that getting away with her friends helped her and she is saying she would discontinue to do those sorts of activities and allow her husband to take care of his own needs.  Interventions: Solution-Oriented/Positive Psychology  Diagnosis:   ICD-10-CM   1. Generalized anxiety disorder  F41.1     Plan: Patient is to use CBT and coping skills to decrease anxiety symptoms.  Patient is to continue finding things to do for fun, volunteering, and her health.  Patient is to continue working on diet and exercise to release anxiety appropriately.  She is also to continue sleeping in a separate room from her husband so she can get better rest. Long-term goal: Enhance ability to handle effectively the full variety of life's anxieties Identify the major life complex in the past and present the form the basis for present anxiety.  Verbalize an understanding of the role that fearful thinking plays in creating fears excessive worries and persistent anxiety symptoms  [name], [facility]

## 2020-02-08 NOTE — Progress Notes (Signed)
GYNECOLOGY  VISIT  CC:   Recheck after 3 months  HPI: 59 y.o. G90P2002 Married White or Caucasian female here for 85mh follow up of estrace cream use.  She was seen 10/13/2019 for new patient exam.  She has been experiencing sleep issues and increased anxiety.  She did have a sleep study with Dr. DBrett Fairy  This was negative.  Seroquel was recommended.  Pt does not want to be on this and has not taken.  Dr. DBrett Fairyreally recommend she sleep in a separate room.  This has really helped.  Does use long acting melatonin but about once a week.  Doesn't really want to take this every night.  Has seen a therapist for years.  Has been on SSRIs in the past.  Pt really doesn't want to start this either.  Is sleeping about six hour straight now which is a huge improvement    Did use estrogen vaginal cream and she did think this helped.  But is not using this regularly.  She is going to use the cream more regularly.    Is still having issues with inability to hold urine when bladder is full.  When she has the urge to void and her bladder is full, she has trouble holding.    GYNECOLOGIC HISTORY: Patient's last menstrual period was 07/14/2011. Contraception: husband vasectomy Menopausal hormone therapy: estrace vaginal cream  Patient Active Problem List   Diagnosis Date Noted  . Chronic insomnia 08/19/2018  . Generalized anxiety disorder 07/14/2018  . 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 Sciatica of left side without back pain 02/09/2015  . 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 Cervical pain 06/30/2014  . Essential (primary) hypertension 06/30/2014  . Disturbance of skin sensation 06/28/2014  . Anxiety 05/05/2014  . 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 Hot flash, menopausal 05/05/2014    Past Medical History:  Diagnosis Date  . Anxiety   . Osteopenia   . Vision abnormalities     Past Surgical History:  Procedure Laterality Date  . DILATION AND CURETTAGE OF UTERUS      MEDS:    Current Outpatient Medications on File Prior to Visit  Medication Sig Dispense Refill  . Ascorbic Acid (VITA-C PO) Take by mouth.    .Marland KitchenCALCIUM PO Take by mouth.    .Marland KitchenLORazepam (ATIVAN) 0.5 MG tablet Take 1/2-1 tab po TID prn anxiety and insomnia 90 tablet 0  . Multiple Vitamin (ONE DAILY) tablet Take by mouth.    . Multiple Vitamins-Minerals (ZINC PO) Take by mouth.    . Omega-3 Fatty Acids (FISH OIL PO) Take by mouth.    . Theanine 50 MG TBDP Take by mouth.    . TURMERIC PO Take by mouth.    .Marland KitchenVITAMIN D PO Take by mouth.    . estradiol (ESTRACE VAGINAL) 0.1 MG/GM vaginal cream 1 gram pv twice weeky (Patient not taking: Reported on 02/11/2020) 42.5 g 4   No current facility-administered medications on file prior to visit.    ALLERGIES: Patient has no known allergies.  Family History  Problem Relation Age of Onset  . Hypertension Mother   . Skin cancer Mother   . Melanoma Father   . Multiple myeloma Father   . Anxiety disorder Daughter   . Depression Son     SH:  Married, non smoker  Review of Systems  Constitutional: Negative.   HENT: Negative.   Eyes: Negative.   Respiratory: Negative.   Cardiovascular: Negative.   Gastrointestinal:  Negative.   Endocrine: Negative.   Genitourinary: Negative.   Musculoskeletal: Negative.   Skin: Negative.   Allergic/Immunologic: Negative.   Neurological: Negative.   Psychiatric/Behavioral: Negative.     PHYSICAL EXAMINATION:    BP 116/72   Pulse 68   Temp (!) 97.3 F (36.3 C) (Skin)   Resp 16   Wt 110 lb (49.9 kg)   LMP 07/14/2011   BMI 18.59 kg/m     General appearance: alert, cooperative and appears stated age Lymph:  no inguinal LAD noted  Pelvic: External genitalia:  no lesions              Urethra:  normal appearing urethra with no masses, tenderness or lesions              Bartholins and Skenes: normal                 Vagina: normal appearing vagina with normal color and discharge, no lesions               Cervix: no lesions              Bimanual Exam:  Uterus:  normal size, contour, position, consistency, mobility, non-tender              Adnexa: no mass, fullness, tenderness  Chaperone, Joy Johhson, CMA, was present for exam.  Assessment: Vaginal atrophic changes, improved Urinary urgency SUI  Plan: Will continue with vaginal estrogen cream D/w pt PT to help with bladder/urinary symptoms.  Pt comfortable with this.  Order placed. BMD due this year.  Order placed.   ~20 minutes total spent with pt in discussion of issues noted above

## 2020-02-11 ENCOUNTER — Ambulatory Visit: Payer: BC Managed Care – PPO | Admitting: Obstetrics & Gynecology

## 2020-02-11 ENCOUNTER — Encounter: Payer: Self-pay | Admitting: Obstetrics & Gynecology

## 2020-02-11 ENCOUNTER — Other Ambulatory Visit: Payer: Self-pay

## 2020-02-11 VITALS — BP 116/72 | HR 68 | Temp 97.3°F | Resp 16 | Wt 110.0 lb

## 2020-02-11 DIAGNOSIS — E2839 Other primary ovarian failure: Secondary | ICD-10-CM | POA: Diagnosis not present

## 2020-02-11 DIAGNOSIS — N952 Postmenopausal atrophic vaginitis: Secondary | ICD-10-CM

## 2020-02-11 DIAGNOSIS — N393 Stress incontinence (female) (male): Secondary | ICD-10-CM | POA: Diagnosis not present

## 2020-02-11 DIAGNOSIS — Z780 Asymptomatic menopausal state: Secondary | ICD-10-CM | POA: Diagnosis not present

## 2020-02-15 ENCOUNTER — Encounter: Payer: Self-pay | Admitting: Obstetrics & Gynecology

## 2020-02-15 ENCOUNTER — Telehealth: Payer: Self-pay | Admitting: Obstetrics & Gynecology

## 2020-02-15 DIAGNOSIS — E2839 Other primary ovarian failure: Secondary | ICD-10-CM

## 2020-02-15 DIAGNOSIS — Z780 Asymptomatic menopausal state: Secondary | ICD-10-CM

## 2020-02-15 NOTE — Telephone Encounter (Signed)
Per review of Epic, patient seen in office on 02/11/20, BMD ordered for [facility].   New BMD order placed for [facility].  Previous BMD order cancelled.   Patient notified by MyChart.   Routing to Dr. Gloris Ham.   Encounter closed.

## 2020-02-15 NOTE — Telephone Encounter (Signed)
Visit Follow-Up Question Received: Today Message Contents  Raedyn, Klinck sent to [facility]  Phone Number: [phone]  Dr. Hyacinth Meeker can you please put my bone density orders in at [facility]. I want to do it there at the same time I get my mammogram.  Thank you,  Mariah [name]

## 2020-03-16 ENCOUNTER — Ambulatory Visit (HOSPITAL_BASED_OUTPATIENT_CLINIC_OR_DEPARTMENT_OTHER)
Admission: RE | Admit: 2020-03-16 | Discharge: 2020-03-16 | Disposition: A | Discharge status: Home / Self Care | Payer: BC Managed Care – PPO | Source: Ambulatory Visit | Attending: Obstetrics & Gynecology | Admitting: Obstetrics & Gynecology

## 2020-03-16 ENCOUNTER — Other Ambulatory Visit: Payer: Self-pay

## 2020-03-16 DIAGNOSIS — E2839 Other primary ovarian failure: Secondary | ICD-10-CM | POA: Insufficient documentation

## 2020-03-16 DIAGNOSIS — Z1231 Encounter for screening mammogram for malignant neoplasm of breast: Secondary | ICD-10-CM

## 2020-03-16 DIAGNOSIS — Z780 Asymptomatic menopausal state: Secondary | ICD-10-CM | POA: Diagnosis not present

## 2020-03-18 ENCOUNTER — Telehealth: Payer: Self-pay

## 2020-03-18 NOTE — Telephone Encounter (Signed)
Spoke with pt. Pt given results and recommendations of BMD per Dr Hyacinth Meeker. Pt states will continue being active, taking vitamin D and calcium and declines consult and Rx treatment at this time. Advised will give update to Dr Hyacinth Meeker. Pt agreeable.   Routing to Dr Hyacinth Meeker.  Encounter closed.   Jerene Bears, MD  03/17/2020 11:26 PM EDT Back to Top    Please let pt know BMD showed osteoporosis at her right femoral neck. Needs consult to discuss findings, possible treatment options and plan for follow up. Thanks.

## 2020-04-12 ENCOUNTER — Ambulatory Visit: Payer: BC Managed Care – PPO | Attending: Obstetrics & Gynecology | Admitting: Physical Therapy

## 2020-04-12 ENCOUNTER — Encounter: Payer: Self-pay | Admitting: Physical Therapy

## 2020-04-12 ENCOUNTER — Other Ambulatory Visit: Payer: Self-pay

## 2020-04-12 DIAGNOSIS — R278 Other lack of coordination: Secondary | ICD-10-CM | POA: Insufficient documentation

## 2020-04-12 DIAGNOSIS — M6281 Muscle weakness (generalized): Secondary | ICD-10-CM | POA: Insufficient documentation

## 2020-04-12 NOTE — Therapy (Signed)
[facility] [address], [address] [place], [place], [address] Phone: [phone]   Fax:  [phone]  Physical Therapy Evaluation  Patient Details  Name: Mariah DoeJanet [name] MRN: [id number] Date of Birth: 06/29/1961 Referring Provider (PT): Jerene BearsMiller, Mary S, MD   Encounter Date: 04/12/2020   PT End of Session - 04/12/20 1006    Visit Number 1    Date for PT Re-Evaluation 06/07/20    Authorization Type BCBS    PT Start Time 1011    PT Stop Time 1100    PT Time Calculation (min) 49 min    Activity Tolerance Patient tolerated treatment well    Behavior During Therapy [facility] for tasks assessed/performed           Past Medical History:  Diagnosis Date  . Anxiety   . Osteopenia   . Vision abnormalities     Past Surgical History:  Procedure Laterality Date  . DILATION AND CURETTAGE OF UTERUS      There were no vitals filed for this visit.    Subjective Assessment - 04/12/20 1008    Subjective Pt referred to OPPT with diagnosis of Stress UI.  Pt reports leakage for years which has worsened over last 2 years.  Leakage tends to be when bladder is full and needs to get to the bathroom.  Wears mini pad daily.  Once leakage starts it is hard to control.    Pertinent History osteoporosis, anxiety    Diagnostic tests BMD test recently    Patient Stated Goals stop leaking    Currently in Pain? No/denies              [facility] - 04/12/20 0001      Assessment   Medical Diagnosis N39.3 (ICD-10-CM) - Stress incontinence    Referring Provider (PT) Jerene BearsMiller, Mary S, MD    Onset Date/Surgical Date --   years   Hand Dominance Right    Next MD Visit --   Jan 2022   Prior Therapy no PF PT      Precautions   Precautions None;Other (comment)    Precaution Comments osteoporosis      Restrictions   Weight Bearing Restrictions No      Balance Screen   Has the patient fallen in the past 6 months No      Home Environment    Living Environment Private residence    Living Arrangements Spouse/significant other    Type of Home House      Prior Function   Level of Independence Independent    Vocation Retired    Leisure exercise via walking, hiking, kayak, paddle board, swim, crafts      Cognition   Overall Cognitive Status Within Functional Limits for tasks assessed      Posture/Postural Control   Posture/Postural Control Postural limitations    Postural Limitations Decreased lumbar lordosis;Decreased thoracic kyphosis      ROM / Strength   AROM / PROM / Strength AROM;Strength      AROM   Overall AROM Comments trunk and hips WFL bil      Strength   Overall Strength Comments hip ER 4/5 bil, all other 5/5      Flexibility   Soft Tissue Assessment /Muscle Length yes    Hamstrings end range limitations bil    Piriformis end range limitations bil      Palpation   Spinal mobility poor ribcage mobility, unable to perform diaphragmatic breathing  Palpation comment no tenderness, tight diaphragm bil                      Objective measurements completed on examination: See above findings.     Pelvic Floor Special Questions - 04/12/20 0001    Prior Pelvic/Prostate Exam Yes    Date of Last Pelvic/Prostate Exam --   this year   Result Pelvic/Prostate Exam  negative    Prior Pregnancies Yes    Number of Pregnancies 2    Number of Vaginal Deliveries 2    Any difficulty with labor and deliveries Yes   forceps, tearing, prolonged pushing   Episiotomy Performed Yes    Currently Sexually Active Yes    Is this Painful Yes    History of sexually transmitted disease No    Marinoff Scale pain interrupts completion   sometimes 1   Urinary Leakage Yes    How often 1-3/day    Pad use daily, change 1-2/day    Activities that cause leaking With strong urge    Urinary urgency Yes    Urinary frequency 7-10/day, 1x/night consistently    Fecal incontinence No    Fluid intake hot tea (green tea,  tumeric tea, calming tea 3-4 mugs/day - all decaf), water 32oz-64oz/day, milk    Caffeine beverages no    Falling out feeling (prolapse) No    Prolapse Anterior Wall;Uterine    Pelvic Floor Internal Exam PT explained and gained consent from Pt    Exam Type Vaginal    Sensation intact    Palpation tender introitus, Pt anxious but PT obtained ongoing consent throughout exam    Strength weak squeeze, no lift    Strength # of reps 3    Strength # of seconds 3                    PT Education - 04/12/20 1134    Education Details "pick up a blueberry" PF contractions 3-5x/day up to 10x10 sec, samples given for lubricant/moisturizer, stress management video ideas, The Happy Menopause book, urge drill, bladder irritants    Person(s) Educated Patient    Methods Explanation;Handout    Comprehension Verbalized understanding            PT Short Term Goals - 04/12/20 1007      PT SHORT TERM GOAL #1   Title ind with initial HEP    Time 4    Period Weeks    Status New    Target Date 05/10/20      PT SHORT TERM GOAL #2   Title Pt will learn about vulvar skin care and incorporate into behavior pattern changes/routine.    Time 4    Period Weeks    Status New    Target Date 05/10/20      PT SHORT TERM GOAL #3   Title Pt will practice diaphragmatic breathing and other techniques learned in PT to address pelvic tension and reduce overall anxiety at least 3x/week.    Time 4    Period Weeks    Status New    Target Date 05/10/20      PT SHORT TERM GOAL #4   Title Pt will improve ability to perform quick flicks with upward lift vs downward bulge with effort.    Time 4    Period Weeks    Status New    Target Date 05/10/20      PT SHORT TERM GOAL #5  Title Pt will report reduced leakage with urge by at least 20%    Time 4    Period Weeks    Status New    Target Date 05/10/20             PT Long Term Goals - 04/12/20 1325      PT LONG TERM GOAL #1   Title Pt will be  ind with behavioral strategies and HEP for pelvic strength, control, breathing and stress management techniques and understand how to safely progress.    Time 8    Period Weeks    Status New    Target Date 06/07/20      PT LONG TERM GOAL #2   Title Pt will demo at least 3/5 pelvic floor strength for improved control with urge.    Baseline squeeze no lift, downward bulge with quick flick attempt, overuse of abdominals and gluteals    Time 8    Period Weeks    Status New    Target Date 06/07/20      PT LONG TERM GOAL #3   Title Pt will report at least 70% improvement in leakage throughout the day.    Time 8    Period Weeks    Status New    Target Date 06/07/20      PT LONG TERM GOAL #4   Title Pt will be able to avoid need for bathroom use at least 3 nights out of the week for more restful night.    Time 8    Period Weeks    Status New    Target Date 06/07/20      PT LONG TERM GOAL #5   Title Pt will consistently achieve Marinoff Scale Score of </= 1/3 with use of self-massage, anxiety downtraining techniques and proper use of lubrication/moisturizers for vulvar skin care.    Time 8    Period Weeks    Status New    Target Date 06/07/20                  Plan - 04/12/20 1330    Clinical Impression Statement Pt referred to PT with long history of urinary leakage x years with worsening over past 2 years.  She leaks daily in small to medium amounts.  She wears pads daily although some days does not need to change the pad.  She leaks with urge to void.  She urinates over 7x/day and 1x/night.  She has history of anxiety which creates tension in PF and prevents sleep.  Recent diagnosis of osteoporosis.  Pt with Marinoff Score of 2/3, PF strength has weak squeeze no lift.  She bulges vs lifts with efforts to perform quick flick.  She has limited ribcage mobiilty and tightness in diaphragm bil.  LE strength and ROM is WNL.  Pt is very active especially with water sports during which  she cannot wear a pad.  PT suggested several references today for menopause and anxiety management and understanding.  PT gave samples for moisturizers and lubricants.  PT educated Pt on urge drill and PF contractions.  Her best cue is "pick up a blueberry" with vagina.  Pt appears to have signs consistent with urethral and anterior vaginal wall prolapse which likely contribute to leakage.  She will benefit from skilled PT to address deficits and improve leakge and self-care strategies for improved quality of life.    Personal Factors and Comorbidities Time since onset of injury/illness/exacerbation;Comorbidity 1;Comorbidity 2  Comorbidities osteoporosis, anxiety    Examination-Activity Limitations Toileting;Sleep    Examination-Participation Restrictions Community Activity    Stability/Clinical Decision Making Stable/Uncomplicated    Clinical Decision Making Low    Rehab Potential Good    PT Frequency 1x / week    PT Duration 12 weeks   long cert period due to delay in follow up care from high clinic census   PT Treatment/Interventions ADLs/Self Care Home Management;Biofeedback;Electrical Stimulation;Moist Heat;Neuromuscular re-education;Therapeutic exercise;Patient/family education;Manual techniques;Spinal Manipulations;Joint Manipulations;Functional mobility training    PT Next Visit Plan vulvar skin care, diaphragmatic release and breathing, introitus massage, recheck PF, work on quick flicks via puffer training, f/u on samples and urge drill and stress videos    PT Home Exercise Plan stress videos/strategies, samples given for moisurizer and lubricants, nightly then tapered use of moisurizer, urge drill, The Happy Menopause book, PF contractions 3-5x/day "blueberry cue", no quick flicks yet    Consulted and Agree with Plan of Care Patient           Patient will benefit from skilled therapeutic intervention in order to improve the following deficits and impairments:  Decreased coordination,  Increased fascial restricitons, Decreased activity tolerance, Postural dysfunction, Decreased strength, Impaired flexibility  Visit Diagnosis: Muscle weakness (generalized) - Plan: PT plan of care cert/re-cert  Other lack of coordination - Plan: PT plan of care cert/re-cert     Problem List Patient Active Problem List   Diagnosis Date Noted  . Chronic insomnia 08/19/2018  . Generalized anxiety disorder 07/14/2018  . 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 Sciatica of left side without back pain 02/09/2015  . 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 Cervical pain 06/30/2014  . Essential (primary) hypertension 06/30/2014  . Disturbance of skin sensation 06/28/2014  . Anxiety 05/05/2014  . 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 Hot flash, menopausal 05/05/2014    Morton Peters, PT 04/12/20 1:42 PM   [facility] [address] [place], [place], [address] Phone: [phone]   Fax:  [phone]  Name: Mariah [name] MRN: [id number] Date of Birth: 12-26-1960

## 2020-04-12 NOTE — Patient Instructions (Signed)
Book: The Happy Menopause  Bladder Irritants  Certain foods and beverages can be irritating to the bladder.  Avoiding these irritants may decrease your symptoms of urinary urgency, frequency or bladder pain.  Even reducing your intake can help with your symptoms.  Not everyone is sensitive to all bladder irritants, so you may consider focusing on one irritant at a time, removing or reducing your intake of that irritant for 7-10 days to see if this change helps your symptoms.  Water intake is also very important.  Below is a list of bladder irritants.  Drinks: alcohol, carbonated beverages, caffeinated beverages such as coffee and tea, drinks with artificial sweeteners, citrus juices, apple juice, tomato juice  Foods: tomatoes and tomato based foods, spicy food, sugar and artificial sweeteners, vinegar, chocolate, raw onion, apples, citrus fruits, pineapple, cranberries, tomatoes, strawberries, plums, peaches, cantaloupe  Other: acidic urine (too concentrated) - see water intake info below  Substitutes you can try that are NOT irritating to the bladder: cooked onion, pears, papayas, sun-brewed decaf teas, watermelons, non-citrus herbal teas, apricots, kava and low-acid instant drinks (Postum).    WATER INTAKE: Remember to drink lots of water (aim for fluid intake of half your body weight with 2/3 of fluids being water).  You may be limiting fluids due to fear of leakage, but this can actually worsen urgency symptoms due to highly concentrated urine.  Water helps balance the pH of your urine so it doesn't become too acidic - acidic urine is a bladder irritant!  Urge Incontinence  . Ideal urination frequency is every 2-4 wakeful hours, which equates to 5-8 times within a 24-hour period.   . Urge incontinence is leakage that occurs when the bladder muscle contracts, creating a sudden need to go before getting to the bathroom.   . Going too often when your bladder isn't actually full can disrupt  the body's automatic signals to store and hold urine longer, which will increase urgency/frequency.  In this case, the bladder "is running the show" and strategies can be learned to retrain this pattern.   . One should be able to control the first urge to urinate, at around .  The bladder can hold up to a "grande latte," or . . To help you gain control, practice the Urge Drill below when urgency strikes.  This drill will help retrain your bladder signals and allow you to store and hold urine longer.  The overall goal is to stretch out your time between voids to reach a more manageable voiding schedule.    . Practice your "quick flicks" often throughout the day (each waking hour) even when you don't need feel the urge to go.  This will help strengthen your pelvic floor muscles, making them more effective in controlling leakage.  Urge Drill  When you feel an urge to go, follow these steps to regain control: 1. Stop what you are doing and be still 2. Take one deep breath, directing your air into your abdomen 3. Think an affirming thought, such as "I've got this." 4. Do 5 quick flicks of your pelvic floor 5. Walk with control to the bathroom to void, or delay voiding    PELVIC FLOOR CONTRACTIONS: LAY ON YOUR BACK OR SIDE, IMAGINE PICKING UP A BLUEBERRY WITH YOUR VAGINA.  TRY TO ISOLATE THIS CONTRACTION AND AVOID USE OF BUTTOCKS AND ABDOMINALS.  HOLD AS LONG AS YOU CAN UP TO 10 SECONDS.  DO NOT HOLD YOUR BREATH.  REPEAT 10 TIMES.  PERFORM 3-5  TIMES A DAY.  AS YOU GET BETTER AT RECRUITING THESE MUSCLES WE WILL INTRO QUICK FLICKS.    Stress Management and Relaxation Techniques We have two major categories in our nervous system.  The "fight or flight" nervous system (called the sympathetic nervous system), and the "calming, rest and digest" nervous system (called the parasympathetic nervous system).  These two systems have opposite effects on our body organs and systems and can impact our heart  rate, blood pressure, breath rate, temperature, GI function, and experience of stress or calm.   Taking time to stimulate the "calming" parasympathetic system can help reduce experience of symptoms of chronic pain conditions, decrease stress and anxiety, and allow [place] to feel more equipped to handle challenges.  Below are strategies, techniques, and video suggestions to help stimulate this calming system.     Ways to Calm the Nervous System Yoga Meditation Mindfulness  Stretching Exercise Deep, slow breathing into belly (diaphragmatic breathing) with focused prolonged exhale Monotasking vs Multi-tasking (do one activity daily that is simple, focused, and slowly performed) Listen to your biorhythms: sleep when tired, rise when rested, eat when hungry, stop when full, etc Social connections - seek connections with others Laughter - laughing helps stimulate our "calming" nervous system Massage - by a practitioner or self-massage (example, feet for reflexology points) Singing or humming Cold exposure - try 30 sec of cold water at the end of your shower  Meditation, Yoga, Breathing, Stretching Video Suggestions [employer] YouTube Videos: Rebeca Alert Meditation for Pelvic Floor Relaxation - [employer] YouTube video . 10-Min Breath Meditation for Pelvic Health and Healing - [employer] YouTube video . Pelvic Floor Release Stretches . Bedtime Yoga for Pelvic Tension . Pelvic Floor Release and Inner Thigh Stretch - Yoga for Pelvic Health (approx. 40 min) Progressive Muscle Relaxation Exercises - search Anne Hahn exercise videos on YouTube . Focused relaxation through guided relaxation, breathing, and contractions/relaxations of various muscle groups Autogenic Relaxation Technique - search videos on YouTube . Uses body's natural relaxation response through guided meditation, inducing heaviness in body, and verbal stimuli/affirmations to create overall feeling of well-being,  slowed breathing, reduced heart rate, reduced blood pressure, reduced stress/anxiety Sympathetic Breathing Meditation - search videos on YouTube . Regulate the nervous system and restore calm through focused breathing to stimulate the parasympathetic nervous system (the opposite of our "fight or flight" sympathetic nervous system) Mindfulness Meditation - search videos on YouTube . Focuses on choosing to be present in the moment, finding enjoyment in the now Diaphragmatic Breathing - search videos on YouTube Guided Imagery for Relaxation - search videos on YouTube

## 2020-04-27 ENCOUNTER — Ambulatory Visit: Payer: BC Managed Care – PPO | Admitting: Physical Therapy

## 2020-04-27 ENCOUNTER — Encounter: Payer: Self-pay | Admitting: Physical Therapy

## 2020-04-27 ENCOUNTER — Other Ambulatory Visit: Payer: Self-pay

## 2020-04-27 DIAGNOSIS — R278 Other lack of coordination: Secondary | ICD-10-CM

## 2020-04-27 DIAGNOSIS — M6281 Muscle weakness (generalized): Secondary | ICD-10-CM

## 2020-04-27 NOTE — Patient Instructions (Signed)
Irritants/Allergens ( on exposure, can cause immediate stinging or burning)  Body fluids: urine, feces, vaginal discharge, sweat, semen  Feminine Hygiene Products: douches, yogurt douche, feminine wipes, baby wipes, sanitary pads, panty liners, tampons, deodorants (including deodorants in tampons/pads), lotions, powders (including talcum powder), perfumes, shampoos,soaps  Sexual support: lubricants, condoms, diaphragms, spermicides, arousal stimulants  Laundry detergent, bleach, fabric softener  Cleansing products: soap, bubble baths and salts, shampoo, conditioner, perfumed toielt paper  Physical irritants: tight fitting clothes, Nylon underwear, synthetic undergarments/pantyhose, chemically treated clothing, latex, wash cloths, sponges, hot water, excessive washing, vigorous drying with towel/hair dryer  HPV medication, tea tree oil, [place]  Alcohol and astringents  Topical medicaments: Benzocaine, Neomycin, Chlorhexidine (in K-Y Jelly), Imidazole antifungal, Propylene glycol ( Preservative used in many products:, tea tree oil, preservatives and bases medications are placed in.   Adapted from the V Book by Elizabeth G. Stewart, MD., and Paula Spencer ([employer], 2002), and Proceedings in Obstetrics and Gynecology, 2014; 4(2):1    Gentle Vulvar care- Adapted from the National Vulvodynia Association and the V Book  Wear loose clothing.  Wear all-cotton underwear, and go without when at home. Avoid wearing pantyhose, wear thigh high or knee high pantyhose. If you do not use underwear with yoga pants, try using an all-cotton against the vulva. Tight clothing can be irritating to the vulva by increasing friction and exacerbate irritation and redness.  Wear loose fitting clothes when possible. When swimming, remove wet bathing suits and shower to remove cholerine from the area..  Avoid hot tubs and heavily chlorinated pools. Shower after exercise to eliminate excess consider avoiding  exercise that causes friction or pressure to the area such as horseback riding, bicycle riding, and spinning classes.   To cleanse the area, use your fingers instead of a washcloth and plain water.  If you feel you must use a cleanser, unscented, non-alkaline cleanser such as Cetaphil, or mild soaps made for sensitive skin such as Dove or Neurtrogena. Be sure any cleaners are unscented. Avoid rubbing the vulva. Soak for five minutes in lukewarm water to remove any residue of sweat or lotions or other products.  Pat dry, and apply any prescribed medication. Avoid products with multiple ingredients.  Even those that sound designed for vulvar care, like A& D Original Ointment, baby lotion, or Vagisil, contain chemicals that could irritate or cause contact dermatitis. Avoid using bubble bath, feminine hygiene sprays, or any type of perfumed creams or sprays near the vulva.  In the bathroom, forgo moistened wipes. If you want moisture, use a spray bottle with plain water, and then pat dry. Use soft, white, unscented toilet paper. Avoid repetitive back and forth motion or rubbing. Drink plenty of water and fluids to dilute urine. Concentrated urine can be irritating to the vulvar skin.   When doing laundry, use hypoallergenic laundry detergent that is very mild, such as those made for babies. Avoid using fabric softeners with undergarments and double rinse undergarments.  For sexual intercourse, be sure to use a lubricant that is without additives or preservatives.  Additives like bactericides, spermicides, warming agents, and flavors can cause vulvar irritation. Rinse the vulva after intercourse. A frozen gel pack wrapped in a towel can be used for irritation after exercise or intercourse.   

## 2020-04-27 NOTE — Therapy (Signed)
[facility] [address] [place], [place], [address] Phone: [phone]   Fax:  [phone]  Physical Therapy Treatment  Patient Details  Name: Mariah [name] MRN: [id number] Date of Birth: [birth year] Referring Provider (PT): Jerene Bears, MD   Encounter Date: 04/27/2020   PT End of Session - 04/27/20 1205    Visit Number 2    Date for PT Re-Evaluation 06/07/20    Authorization Type BCBS    PT Start Time 0935    PT Stop Time 1016    PT Time Calculation (min) 41 min    Activity Tolerance Patient tolerated treatment well    Behavior During Therapy [facility] for tasks assessed/performed           Past Medical History:  Diagnosis Date  . Anxiety   . Osteopenia   . Vision abnormalities     Past Surgical History:  Procedure Laterality Date  . DILATION AND CURETTAGE OF UTERUS      There were no vitals filed for this visit.   Subjective Assessment - 04/27/20 0934    Subjective I've been trying to do the exercises and I'm not sure it is helping me yet.  The worst leakage time is when I get home and have to get out of car and stand/walk.    Pertinent History osteoporosis, anxiety    Diagnostic tests BMD test recently    Patient Stated Goals stop leaking    Currently in Pain? No/denies                             [facility] Adult PT Treatment/Exercise - 04/27/20 0001      Self-Care   Self-Care Other Self-Care Comments    Other Self-Care Comments  vulvar skin care, behavioral habits of timed water intake and voiding, use of bladder/fluid intake log      Neuro Re-ed    Neuro Re-ed Details  puffer training in SL with PT and self-palpation for PF, diaphragmatic breathing with PT and self-cueing via TCs and VCs (hands on ribcage, spring off, hands on belly, "push belly out with inhale"      Exercises   Exercises Lumbar;Knee/Hip      Lumbar Exercises: Stretches   Lower Trunk Rotation 3 reps;10 seconds     Other Lumbar Stretch Exercise happy baby with rocking x 1'    Other Lumbar Stretch Exercise bil hip windshield wipers supine x 20      Knee/Hip Exercises: Supine   Other Supine Knee/Hip Exercises supine butterfly stretch with breathing training (neuro re-ed)                    PT Short Term Goals - 04/27/20 1204      PT SHORT TERM GOAL #1   Title ind with initial HEP    Status Achieved      PT SHORT TERM GOAL #2   Title Pt will learn about vulvar skin care and incorporate into behavior pattern changes/routine.    Status Achieved      PT SHORT TERM GOAL #3   Title Pt will practice diaphragmatic breathing and other techniques learned in PT to address pelvic tension and reduce overall anxiety at least 3x/week.    Status On-going      PT SHORT TERM GOAL #4   Title Pt will improve ability to perform quick flicks with upward lift vs downward bulge with effort.  Status On-going      PT SHORT TERM GOAL #5   Title Pt will report reduced leakage with urge by at least 20%    Status New             PT Long Term Goals - 04/12/20 1325      PT LONG TERM GOAL #1   Title Pt will be ind with behavioral strategies and HEP for pelvic strength, control, breathing and stress management techniques and understand how to safely progress.    Time 8    Period Weeks    Status New    Target Date 06/07/20      PT LONG TERM GOAL #2   Title Pt will demo at least 3/5 pelvic floor strength for improved control with urge.    Baseline squeeze no lift, downward bulge with quick flick attempt, overuse of abdominals and gluteals    Time 8    Period Weeks    Status New    Target Date 06/07/20      PT LONG TERM GOAL #3   Title Pt will report at least 70% improvement in leakage throughout the day.    Time 8    Period Weeks    Status New    Target Date 06/07/20      PT LONG TERM GOAL #4   Title Pt will be able to avoid need for bathroom use at least 3 nights out of the week for more  restful night.    Time 8    Period Weeks    Status New    Target Date 06/07/20      PT LONG TERM GOAL #5   Title Pt will consistently achieve Marinoff Scale Score of </= 1/3 with use of self-massage, anxiety downtraining techniques and proper use of lubrication/moisturizers for vulvar skin care.    Time 8    Period Weeks    Status New    Target Date 06/07/20                 Plan - 04/27/20 0956    Clinical Impression Statement Pt reported ongoing leakage with transition from car to standing and walking to get in house after running errands.  PT discussed use of bladder/void log and behavioral changes to include planned/timed water intake and voiding around errands to reduce intake during errands without sacrificing daily water intake.  PT gave VCs and TCs for improved diaphragmatic breathing.  Pt was able to achieve this and PT palpated relaxation of PF with proper deep inhale by end of session.  PT initiated puffer training in SL and Pt started to have more success with lift vs bulge of PF.  PT gave puffer training and breathing for HEP additions today.  Also gave vulvar skincare info and discussed.  Pt will continue to benefit from skilled PT to address urgency/leakage symptoms.    Comorbidities osteoporosis, anxiety    Rehab Potential Good    PT Frequency 1x / week    PT Duration 12 weeks    PT Treatment/Interventions ADLs/Self Care Home Management;Biofeedback;Electrical Stimulation;Moist Heat;Neuromuscular re-education;Therapeutic exercise;Patient/family education;Manual techniques;Spinal Manipulations;Joint Manipulations;Functional mobility training    PT Next Visit Plan review bladder log, timed water intake/voiding around errands, diaphragmatic breathing and puffer training review.  Sit to stand with PF, step ups, bridges.    PT Home Exercise Plan behavioral changes with water intake and void timing, vulvar skin care, bladder log, puffer training in SL with self-palpation,  diaphragmatic breathing  Consulted and Agree with Plan of Care Patient           Patient will benefit from skilled therapeutic intervention in order to improve the following deficits and impairments:     Visit Diagnosis: Muscle weakness (generalized)  Other lack of coordination     Problem List Patient Active Problem List   Diagnosis Date Noted  . Chronic insomnia 08/19/2018  . Generalized anxiety disorder 07/14/2018  . 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 Sciatica of left side without back pain 02/09/2015  . 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 Cervical pain 06/30/2014  . Essential (primary) hypertension 06/30/2014  . Disturbance of skin sensation 06/28/2014  . Anxiety 05/05/2014  . 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 Hot flash, menopausal 05/05/2014    Morton Peters, PT 04/27/20 12:09 PM    [facility] [address], [address] [place], [place], [address] Phone: [phone]   Fax:  [phone]  Name: Hermela Hardt MRN: [id number] Date of Birth: 08/12/1961

## 2020-05-09 ENCOUNTER — Ambulatory Visit: Payer: BC Managed Care – PPO | Admitting: Psychiatry

## 2020-05-10 ENCOUNTER — Ambulatory Visit: Payer: BC Managed Care – PPO | Attending: Obstetrics & Gynecology | Admitting: Physical Therapy

## 2020-05-10 ENCOUNTER — Other Ambulatory Visit: Payer: Self-pay

## 2020-05-10 ENCOUNTER — Encounter: Payer: Self-pay | Admitting: Physical Therapy

## 2020-05-10 ENCOUNTER — Ambulatory Visit (INDEPENDENT_AMBULATORY_CARE_PROVIDER_SITE_OTHER): Payer: BC Managed Care – PPO | Admitting: Psychiatry

## 2020-05-10 DIAGNOSIS — R278 Other lack of coordination: Secondary | ICD-10-CM

## 2020-05-10 DIAGNOSIS — F411 Generalized anxiety disorder: Secondary | ICD-10-CM

## 2020-05-10 DIAGNOSIS — M6281 Muscle weakness (generalized): Secondary | ICD-10-CM | POA: Diagnosis not present

## 2020-05-10 NOTE — Progress Notes (Signed)
[facility] Counselor/Therapist Progress Note  Patient ID: Mariah [name], MRN: [id number],    Date: 05/10/2020  Time Spent: 51 minutes start time 11:07AM end time 11:58 AM  Treatment Type: Individual Therapy  Reported Symptoms: sleep issues, anxiety, sadness  Mental Status Exam:  Appearance:   Well Groomed     Behavior:  Appropriate  Motor:  Normal  Speech/Language:   Normal Rate  Affect:  Appropriate  Mood:  anxious  Thought process:  normal  Thought content:    WNL  Sensory/Perceptual disturbances:    WNL  Orientation:  oriented to person, place, time/date and situation  Attention:  Good  Concentration:  Good  Memory:  WNL  Fund of knowledge:   Good  Insight:    Good  Judgment:   Good  Impulse Control:  Good   Risk Assessment: Danger to Self:  No Self-injurious Behavior: No Danger to Others: No Duty to Warn:no Physical Aggression / Violence:No  Access to Firearms a concern: No  Gang Involvement:No   Subjective: Patient was present for session.  She shared that she had a bad night due to worrying about her friends daughter, who is in [place] and was drugged and missing.  She is in PT for her pelvic floor. She shared that all of her muscles are struggling with not exercising due to COVID.  She shared that things are stressful with her daughter.  Patient went on to share she is not sure how to communicate with her and that is causing lots of difficulties and stress for her.  Patient stated her daughter's mood is typically negative or she does not engage at all.  Patient was encouraged to just use open ended questions with her daughter so that she can encourage some communication.  Patient was encouraged to limit the time that she spends with her when needed.  Patient was encouraged to allow her daughter to pursue her at times and if she has other plans not to break them but to follow through with what she is doing.  Patient was also reminded of box theory and to keep her issues  in her box which is a think she can control fix and change and to put her daughter's issues outside of her box that she cannot control fix or change them.  Patient also shared that she is finding [place] to disconnect and does much better.  She shared there are still some issues with her husband did not want to just dressed them in the session.  Patient did report that she is going on a hiking trip to the [facility] with her son in 6 weeks and wants to have a session prior to that to make sure she feels ready to communicate with him.  Currently he is still involved with his girlfriend and so his mood seems better and she is able to let go of his issues at this time.  Patient went on to share that she is concerned that her daughter will never forgive her for things set up occurred in the past including her having learning disabilities and patient not always knowing how to help her.  Patient was encouraged to see if she forgives herself and has let go with that guilt especially over things that she had no control over that occurred.  Patient stated she is doing better but still at times has that guilt to surface.  Interventions: Cognitive Behavioral Therapy and Solution-Oriented/Positive Psychology  Diagnosis:   ICD-10-CM   1. Generalized anxiety disorder  F41.1     Plan: Patient is to use CBT and coping skills to decrease anxiety symptoms.  Patient is to work on asking her daughter open-ended questions when they are together to see if communication can improve.  Patient is to continue focusing on diet and exercise to release anxiety in an appropriate manner. Long-term goal: Enhance ability to handle effectively the full variety of life's anxieties Short-term goal: Identify the major life complex in the past and present the form the basis for present anxiety.  Verbalize an understanding of the role that fearful thinking plays in creating  fears excessive worry and persistent anxiety  [name], [facility]

## 2020-05-10 NOTE — Patient Instructions (Signed)
Access Code: [email or web address] URL: [email or web address]: 08/10/2021Prepared by: Loistine Simas BeuhringExercises  Supine Bridge with [employer] Between Knees - 1 x daily - 7 x weekly - 2 sets - 10 reps  Sit to Stand with [employer] Between Knees - 1 x daily - 7 x weekly - 2 sets - 10 reps  Bird Dog - 1 x daily - 7 x weekly - 2 sets - 10 reps  Supine Pelvic Floor Stretch - 1 x daily - 7 x weekly - 1 sets - 3 reps - 60 hold  Child's Pose Stretch - 1 x daily - 7 x weekly - 1 sets - 3 reps - 10 hold  Child's Pose with Sidebending - 1 x daily - 7 x weekly - 1 sets - 3 reps - 10 hold  Sidelying Thoracic Rotation with Open Book - 1 x daily - 7 x weekly - 1 sets - 3 reps - 10 hold

## 2020-05-10 NOTE — Therapy (Signed)
[facility] [address] [place], [place], [address] Phone: [phone]   Fax:  [phone]  Physical Therapy Treatment  Patient Details  Name: Mariah [name] MRN: [id number] Date of Birth: Feb 02, 1961 Referring Provider (PT): Jerene Bears, MD   Encounter Date: 05/10/2020   PT End of Session - 05/10/20 1004    Visit Number 3    Date for PT Re-Evaluation 06/07/20    Authorization Type BCBS    PT Start Time 1005    PT Stop Time 1043    PT Time Calculation (min) 38 min    Activity Tolerance Patient tolerated treatment well    Behavior During Therapy [facility] for tasks assessed/performed           Past Medical History:  Diagnosis Date  . Anxiety   . Osteopenia   . Vision abnormalities     Past Surgical History:  Procedure Laterality Date  . DILATION AND CURETTAGE OF UTERUS      There were no vitals filed for this visit.   Subjective Assessment - 05/10/20 1005    Subjective Pt arrives with bladder log. Has not been able to drink hot tea due to a sore tooth so patterns may be off.  I am not drinking as much as I thought I was.  I am changing my intake of fluids pattern and it seems to be helping.    Pertinent History osteoporosis, anxiety    Diagnostic tests BMD test recently    Patient Stated Goals stop leaking    Currently in Pain? No/denies                             [facility] Adult PT Treatment/Exercise - 05/10/20 0001      Neuro Re-ed    Neuro Re-ed Details  ongoing verbal cueing of PF throughout ther ex      Exercises   Exercises Lumbar;Knee/Hip      Lumbar Exercises: Stretches   Other Lumbar Stretch Exercise happy baby with rocking x 1'    Other Lumbar Stretch Exercise open books x 3 each with breathing at motion barriers into lateral and posterior ribcage x 3 bil      Lumbar Exercises: Aerobic   Elliptical L5 incline 6 x 5', PT present to review bladder log      Lumbar Exercises:  Supine   Bridge with [employer] 10 reps    Bridge with [employer] Limitations with PF lift      Lumbar Exercises: Prone   Other Prone Lumbar Exercises child's pose, child's pose with SB, 3x10 each side      Lumbar Exercises: Quadruped   Plank 2x15 sec, d/c'd due to back pain    Other Quadruped Lumbar Exercises bird dog on mat table x 10 reps, PT cued PF and TrA, level pelvis      Knee/Hip Exercises: Seated   Sit to Sand 2 sets;5 reps;without UE support   with ball squeeze and PF "blueberry lift"                 PT Education - 05/10/20 1041    Education Details Access Code: [email or web address]    Person(s) Educated Patient    Methods Explanation;Demonstration;Verbal cues;Handout    Comprehension Verbalized understanding;Returned demonstration;Tactile cues required            PT Short Term Goals - 05/10/20 1004      PT SHORT TERM  GOAL #1   Title ind with initial HEP    Status Achieved      PT SHORT TERM GOAL #2   Title Pt will learn about vulvar skin care and incorporate into behavior pattern changes/routine.    Status Achieved      PT SHORT TERM GOAL #3   Title Pt will practice diaphragmatic breathing and other techniques learned in PT to address pelvic tension and reduce overall anxiety at least 3x/week.    Status On-going      PT SHORT TERM GOAL #4   Title Pt will improve ability to perform quick flicks with upward lift vs downward bulge with effort.    Baseline improving slow recruitment awareness and pattern, working toward quick flicks    Status On-going      PT SHORT TERM GOAL #5   Title Pt will report reduced leakage with urge by at least 20%    Baseline improving with fluid intake timing focus    Status On-going             PT Long Term Goals - 04/12/20 1325      PT LONG TERM GOAL #1   Title Pt will be ind with behavioral strategies and HEP for pelvic strength, control, breathing and stress management techniques and understand how to safely progress.     Time 8    Period Weeks    Status New    Target Date 06/07/20      PT LONG TERM GOAL #2   Title Pt will demo at least 3/5 pelvic floor strength for improved control with urge.    Baseline squeeze no lift, downward bulge with quick flick attempt, overuse of abdominals and gluteals    Time 8    Period Weeks    Status New    Target Date 06/07/20      PT LONG TERM GOAL #3   Title Pt will report at least 70% improvement in leakage throughout the day.    Time 8    Period Weeks    Status New    Target Date 06/07/20      PT LONG TERM GOAL #4   Title Pt will be able to avoid need for bathroom use at least 3 nights out of the week for more restful night.    Time 8    Period Weeks    Status New    Target Date 06/07/20      PT LONG TERM GOAL #5   Title Pt will consistently achieve Marinoff Scale Score of </= 1/3 with use of self-massage, anxiety downtraining techniques and proper use of lubrication/moisturizers for vulvar skin care.    Time 8    Period Weeks    Status New    Target Date 06/07/20                 Plan - 05/10/20 1045    Clinical Impression Statement Pt arrived feeling unwell after a headache in the night.  She had filled out a bladder log x 3 days and noted 2 episodes of light leakage in three days. She is realizing she isn't getting as many fluids as she thought.  She is changing her fluid intake pattern and aiming for more fluids when at home vs during errands to reduce urge/leakage upon arriving home which is helping.  She has confidence in her PF recruitment pattern so PT progressed HEP to include functional strength with ongoing focus on PF strength for endurance  within each exercise.  Pt noted LBP with planks so d/c'd at this time and will revisit.  Pt has ongoing upper abdominal gripping pattern for stability so PT is working with her on breath work and downtraining of this with focus on PF and TrA recruitment to replace with more optimal strategy.  Pt will  continue to benefit from ongoing PT along POC.    Comorbidities osteoporosis, anxiety    PT Frequency 1x / week    PT Duration 12 weeks    PT Treatment/Interventions ADLs/Self Care Home Management;Biofeedback;Electrical Stimulation;Moist Heat;Neuromuscular re-education;Therapeutic exercise;Patient/family education;Manual techniques;Spinal Manipulations;Joint Manipulations;Functional mobility training    PT Next Visit Plan review STGs, try soft foam roller along spine with UE Y, W, T and breathwork, thoracic ext over foam, DKTC with foam under pelvis, f/u on updated HEP, counter plank and step ups    PT Home Exercise Plan behavioral changes with water intake and void timing, vulvar skin care, bladder log, puffer training in SL with self-palpation, diaphragmatic breathing    Consulted and Agree with Plan of Care Patient           Patient will benefit from skilled therapeutic intervention in order to improve the following deficits and impairments:     Visit Diagnosis: Muscle weakness (generalized)  Other lack of coordination     Problem List Patient Active Problem List   Diagnosis Date Noted  . Chronic insomnia 08/19/2018  . Generalized anxiety disorder 07/14/2018  . 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 Sciatica of left side without back pain 02/09/2015  . 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 Cervical pain 06/30/2014  . Essential (primary) hypertension 06/30/2014  . Disturbance of skin sensation 06/28/2014  . Anxiety 05/05/2014  . 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 Hot flash, menopausal 05/05/2014    Morton Peters, PT 05/10/20 10:54 AM   [facility] [address], [address] [place], [place], [address] Phone: [phone]   Fax:  [phone]  Name: Mariah [name] MRN: [id number] Date of Birth: Apr 25, 1961

## 2020-05-16 ENCOUNTER — Encounter: Payer: Self-pay | Admitting: Physical Therapy

## 2020-05-16 ENCOUNTER — Ambulatory Visit: Payer: BC Managed Care – PPO | Admitting: Physical Therapy

## 2020-05-16 ENCOUNTER — Other Ambulatory Visit: Payer: Self-pay

## 2020-05-16 DIAGNOSIS — R278 Other lack of coordination: Secondary | ICD-10-CM

## 2020-05-16 DIAGNOSIS — M6281 Muscle weakness (generalized): Secondary | ICD-10-CM | POA: Diagnosis not present

## 2020-05-16 NOTE — Therapy (Signed)
[facility] [address], [address] [place], [place], [address] Phone: [phone]   Fax:  [phone]  Physical Therapy Treatment  Patient Details  Name: Mariah [name] MRN: [id number] Date of Birth: Apr 17, 1961 Referring Provider (PT): Jerene Bears, MD   Encounter Date: 05/16/2020   PT End of Session - 05/16/20 0756    Visit Number 4    Date for PT Re-Evaluation 06/07/20    Authorization Type BCBS    PT Start Time 0756    PT Stop Time 0842    PT Time Calculation (min) 46 min    Activity Tolerance Patient tolerated treatment well    Behavior During Therapy [facility] for tasks assessed/performed           Past Medical History:  Diagnosis Date  . Anxiety   . Osteopenia   . Vision abnormalities     Past Surgical History:  Procedure Laterality Date  . DILATION AND CURETTAGE OF UTERUS      There were no vitals filed for this visit.   Subjective Assessment - 05/16/20 0757    Subjective No leakage since I was last here.  Feeling good and doing all my HEP.    Pertinent History osteoporosis, anxiety    Diagnostic tests BMD test recently    Patient Stated Goals stop leaking    Currently in Pain? No/denies                             [facility] Adult PT Treatment/Exercise - 05/16/20 0001      Neuro Re-ed    Neuro Re-ed Details  supine feet over bolster hips slightly ER TCs for diaphragmatic breathing, VCs for PF relaxation 5 inhale, 3 hold, 5 exhale      Exercises   Exercises Lumbar;Knee/Hip      Lumbar Exercises: Stretches   Other Lumbar Stretch Exercise open books x 3 each with breathing at motion barriers into lateral and posterior ribcage x 3 bil      Lumbar Exercises: Aerobic   Elliptical L5 incline 5 x 6', PT present to review goals and progress      Lumbar Exercises: Seated   Other Seated Lumbar Exercises A/ROM dissociate ribcage from pelvis - lateral glides, thoracic SB, thoracic rotation    Other  Seated Lumbar Exercises pelvic ROM on ball - x 10 each: tail wags, circles, tilts      Lumbar Exercises: Quadruped   Madcat/Old Horse 10 reps    Other Quadruped Lumbar Exercises thread the needle bil 3x10 sec      Knee/Hip Exercises: Standing   Forward Step Up Both;5 reps;Hand Hold: 2    Forward Step Up Limitations PF pick up with contralateral march    Functional Squat 10 reps    Functional Squat Limitations on BOSU, Pt had trouble concentrating on PF      Knee/Hip Exercises: Supine   Bridges with Newman Pies Squeeze Strengthening;Both;10 reps   with knee ext bil each rep                 PT Education - 05/16/20 0835    Education Details diaphragmatic breathing    Person(s) Educated Patient    Methods Explanation;Handout;Verbal cues;Tactile cues    Comprehension Verbal cues required;Tactile cues required;Returned demonstration            PT Short Term Goals - 05/16/20 0756      PT SHORT TERM GOAL #1  Title ind with initial HEP    Status Achieved      PT SHORT TERM GOAL #2   Title Pt will learn about vulvar skin care and incorporate into behavior pattern changes/routine.    Status Achieved      PT SHORT TERM GOAL #3   Title Pt will practice diaphragmatic breathing and other techniques learned in PT to address pelvic tension and reduce overall anxiety at least 3x/week.             PT Long Term Goals - 04/12/20 1325      PT LONG TERM GOAL #1   Title Pt will be ind with behavioral strategies and HEP for pelvic strength, control, breathing and stress management techniques and understand how to safely progress.    Time 8    Period Weeks    Status New    Target Date 06/07/20      PT LONG TERM GOAL #2   Title Pt will demo at least 3/5 pelvic floor strength for improved control with urge.    Baseline squeeze no lift, downward bulge with quick flick attempt, overuse of abdominals and gluteals    Time 8    Period Weeks    Status New    Target Date 06/07/20      PT  LONG TERM GOAL #3   Title Pt will report at least 70% improvement in leakage throughout the day.    Time 8    Period Weeks    Status New    Target Date 06/07/20      PT LONG TERM GOAL #4   Title Pt will be able to avoid need for bathroom use at least 3 nights out of the week for more restful night.    Time 8    Period Weeks    Status New    Target Date 06/07/20      PT LONG TERM GOAL #5   Title Pt will consistently achieve Marinoff Scale Score of </= 1/3 with use of self-massage, anxiety downtraining techniques and proper use of lubrication/moisturizers for vulvar skin care.    Time 8    Period Weeks    Status New    Target Date 06/07/20                 Plan - 05/16/20 0842    Clinical Impression Statement Pt reports no leakage since last visit and good compliance with HEP.  She declines pelvic manual PT.  PT focusing on releasing thoracic oblique holding pattern with ROM, breathing and stretching.  Pt with good awareness of PF per self report and was able to feel contraction with step ups but not with BOSU due to high challenge with balance with squats on BOSU.  Pt needs ongoing cueing for diaphragmatic breathing and has difficulty with this.  PT gave this for 3-5 min daily with written instructions for set up and focus.  Continue along POC.    Comorbidities osteoporosis, anxiety    PT Frequency 1x / week    PT Duration 12 weeks    PT Treatment/Interventions ADLs/Self Care Home Management;Biofeedback;Electrical Stimulation;Moist Heat;Neuromuscular re-education;Therapeutic exercise;Patient/family education;Manual techniques;Spinal Manipulations;Joint Manipulations;Functional mobility training    PT Next Visit Plan f/u on diaphragmatic breathing, review STGs, try soft foam roller along spine with UE Y, W, T and breathwork, thoracic ext over foam, DKTC with foam under pelvis, f/u on updated HEP, counter plank and step ups    PT Home Exercise Plan behavioral changes with  water  intake and void timing, vulvar skin care, bladder log, puffer training in SL with self-palpation, diaphragmatic breathing    Consulted and Agree with Plan of Care Patient           Patient will benefit from skilled therapeutic intervention in order to improve the following deficits and impairments:     Visit Diagnosis: Muscle weakness (generalized)  Other lack of coordination     Problem List Patient Active Problem List   Diagnosis Date Noted  . Chronic insomnia 08/19/2018  . Generalized anxiety disorder 07/14/2018  . 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 Sciatica of left side without back pain 02/09/2015  . 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 Cervical pain 06/30/2014  . Essential (primary) hypertension 06/30/2014  . Disturbance of skin sensation 06/28/2014  . Anxiety 05/05/2014  . 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 Hot flash, menopausal 05/05/2014    Morton Peters, PT 05/16/20 8:45 AM   [facility] [address] [place], [place], [address] Phone: [phone]   Fax:  [phone]  Name: Mariah [name] MRN: [id number] Date of Birth: 13-Aug-1961

## 2020-05-16 NOTE — Patient Instructions (Signed)
Lay on your back with knees over bolster, ankles crossed. Deep slow inhale into beach ball in your belly and pelvis, into ribcage and back - slow inhale over slow 5 count Hold the top of inhale x 3 sec Slow exhale x 5 seconds (no pursed lips).  See if you can feel the pelvic area relax and descend (open up) with deep inhale

## 2020-05-23 ENCOUNTER — Encounter: Payer: Self-pay | Admitting: Physical Therapy

## 2020-05-23 ENCOUNTER — Ambulatory Visit: Payer: BC Managed Care – PPO | Admitting: Physical Therapy

## 2020-05-23 ENCOUNTER — Other Ambulatory Visit: Payer: Self-pay

## 2020-05-23 DIAGNOSIS — M6281 Muscle weakness (generalized): Secondary | ICD-10-CM | POA: Diagnosis not present

## 2020-05-23 DIAGNOSIS — R278 Other lack of coordination: Secondary | ICD-10-CM

## 2020-05-23 NOTE — Therapy (Signed)
[facility] [address], [address] [place], [place], [address] Phone: [phone]   Fax:  [phone]  Physical Therapy Treatment  Patient Details  Name: Mariah [name] MRN: [id number] Date of Birth: 12/14/1960 Referring Provider (PT): Jerene Bears, MD   Encounter Date: 05/23/2020   PT End of Session - 05/23/20 0926    Visit Number 5    Date for PT Re-Evaluation 06/07/20    Authorization Type BCBS    PT Start Time 0927    PT Stop Time 1015    PT Time Calculation (min) 48 min    Activity Tolerance Patient tolerated treatment well    Behavior During Therapy [facility] for tasks assessed/performed           Past Medical History:  Diagnosis Date  . Anxiety   . Osteopenia   . Vision abnormalities     Past Surgical History:  Procedure Laterality Date  . DILATION AND CURETTAGE OF UTERUS      There were no vitals filed for this visit.   Subjective Assessment - 05/23/20 0926    Subjective I think I'm getting some more control over my leakage.  When I have leaked it has been in smaller amounts.  Doing my HEP.    Pertinent History osteoporosis, anxiety    Diagnostic tests BMD test recently    Patient Stated Goals stop leaking    Currently in Pain? No/denies                             [facility] Adult PT Treatment/Exercise - 05/23/20 0001      Neuro Re-ed    Neuro Re-ed Details  prone over pillows hum for TrA then add PF connection 10x3 sec, SL PF contractions with self-palpation and PT palpation through clothes for feedback at anus: 10x3, puffer training x 10 reps, quick flicks      Lumbar Exercises: Stretches   Double Knee to Chest Stretch 5 reps;10 seconds    Double Knee to Chest Stretch Limitations soft foam roller under pelvis, then happy baby with foam x 60 sec    Other Lumbar Stretch Exercise diaphragmatic breathing draped over green ball in kneeling, PT TCs lower and lateral costal expansion x 10 reps       Lumbar Exercises: Aerobic   Elliptical L5 incline 5 x 6', PT present to review goals and progress      Lumbar Exercises: Supine   Other Supine Lumbar Exercises vertical foam roller Ys, Ws, Ts with deep breathing   TCs at lateral ribcage bil   Other Supine Lumbar Exercises kneeling over ball lateral and posterior costal expansion x 10 breath cycles                    PT Short Term Goals - 05/16/20 0756      PT SHORT TERM GOAL #1   Title ind with initial HEP    Status Achieved      PT SHORT TERM GOAL #2   Title Pt will learn about vulvar skin care and incorporate into behavior pattern changes/routine.    Status Achieved      PT SHORT TERM GOAL #3   Title Pt will practice diaphragmatic breathing and other techniques learned in PT to address pelvic tension and reduce overall anxiety at least 3x/week.             PT Long Term Goals - 04/12/20 1325  PT LONG TERM GOAL #1   Title Pt will be ind with behavioral strategies and HEP for pelvic strength, control, breathing and stress management techniques and understand how to safely progress.    Time 8    Period Weeks    Status New    Target Date 06/07/20      PT LONG TERM GOAL #2   Title Pt will demo at least 3/5 pelvic floor strength for improved control with urge.    Baseline squeeze no lift, downward bulge with quick flick attempt, overuse of abdominals and gluteals    Time 8    Period Weeks    Status New    Target Date 06/07/20      PT LONG TERM GOAL #3   Title Pt will report at least 70% improvement in leakage throughout the day.    Time 8    Period Weeks    Status New    Target Date 06/07/20      PT LONG TERM GOAL #4   Title Pt will be able to avoid need for bathroom use at least 3 nights out of the week for more restful night.    Time 8    Period Weeks    Status New    Target Date 06/07/20      PT LONG TERM GOAL #5   Title Pt will consistently achieve Marinoff Scale Score of </= 1/3 with use of  self-massage, anxiety downtraining techniques and proper use of lubrication/moisturizers for vulvar skin care.    Time 8    Period Weeks    Status New    Target Date 06/07/20                 Plan - 05/23/20 1222    Clinical Impression Statement Pt doing better with diaphragmatic breathing, especially in quadruped over ball with TCs today.  She continues to overuse IO vs TrA with attempts to perform PF/TrA contractions.  PT discovered Pt humming low note to recruit TrA was best strategy for her.  PT encouraged self-palpation at anus at home in SL to work on PF contractions, puffer training and quick flicks to confirm lift vs bulge with this.  Continue along POC.    Comorbidities osteoporosis, anxiety    Rehab Potential Good    PT Frequency 1x / week    PT Duration 12 weeks    PT Treatment/Interventions ADLs/Self Care Home Management;Biofeedback;Electrical Stimulation;Moist Heat;Neuromuscular re-education;Therapeutic exercise;Patient/family education;Manual techniques;Spinal Manipulations;Joint Manipulations;Functional mobility training    PT Next Visit Plan f/u on SL self-palpation at anus for PF contractions, open books and green ball draping for breathwork, prone over pillows hum for TrA, progress to quadruped, bridge series    PT Home Exercise Plan behavioral changes with water intake and void timing, vulvar skin care, bladder log, puffer training in SL with self-palpation, diaphragmatic breathing    Consulted and Agree with Plan of Care Patient           Patient will benefit from skilled therapeutic intervention in order to improve the following deficits and impairments:     Visit Diagnosis: Muscle weakness (generalized)  Other lack of coordination     Problem List Patient Active Problem List   Diagnosis Date Noted  . Chronic insomnia 08/19/2018  . Generalized anxiety disorder 07/14/2018  . 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 Sciatica of left side without back  pain 02/09/2015  . 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 Cervical pain 06/30/2014  . Essential (  primary) hypertension 06/30/2014  . Disturbance of skin sensation 06/28/2014  . Anxiety 05/05/2014  . 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 Hot flash, menopausal 05/05/2014    Morton Peters, PT 05/23/20 12:29 PM   [facility] [address] [place], [place], [address] Phone: [phone]   Fax:  [phone]  Name: Myrakle Wingler MRN: [id number] Date of Birth: 1961/03/26

## 2020-05-23 NOTE — Patient Instructions (Signed)
Deep breaths kneeling over ball or bolster + pillows with ribcage higher than hips - breathe into low back and sides of trunk x 10 reps, slow Foam roller laying along spine, breathe in while arms move into each position, exhale and allow arms to become heavy in that position: Ys, Ws, Ts x 10 cycles Sidelying: self-palpation around anus, 10 reps x 3 sec holds of pelvic floor ISOLATION - NO ABS.  THEN 10 PUFFER TRAINING BREATHS INTO FIST, FEEL FOR ANUS TO DRAW UP NOT BULGE, THEN NO PUFFS OF AIR, 10 QUICK FLICKS FEELING ANUS TO LIFT.  Lay over 2 pillows under waist, hum low note to find lower abs (this is subtle!!!!), then stop hum and hold and breathe x 1 breath cycle.  Repeat x 5 reps. Add pelvic floor lift to hum x 5 reps, hold 3 sec.

## 2020-05-30 ENCOUNTER — Other Ambulatory Visit: Payer: Self-pay

## 2020-05-30 ENCOUNTER — Encounter: Payer: Self-pay | Admitting: Physical Therapy

## 2020-05-30 ENCOUNTER — Ambulatory Visit: Payer: BC Managed Care – PPO | Admitting: Physical Therapy

## 2020-05-30 DIAGNOSIS — R278 Other lack of coordination: Secondary | ICD-10-CM

## 2020-05-30 DIAGNOSIS — M6281 Muscle weakness (generalized): Secondary | ICD-10-CM | POA: Diagnosis not present

## 2020-05-30 NOTE — Therapy (Signed)
[facility] [address] [place], [place], [address] Phone: [phone]   Fax:  [phone]  Physical Therapy Treatment  Patient Details  Name: Mariah [name] MRN: [id number] Date of Birth: 10-18-1960 Referring Provider (PT): Jerene Bears, MD   Encounter Date: 05/30/2020   PT End of Session - 05/30/20 0919    Visit Number 6    Date for PT Re-Evaluation 06/07/20    Authorization Type BCBS    PT Start Time 0925    PT Stop Time 1012    PT Time Calculation (min) 47 min    Activity Tolerance Patient tolerated treatment well    Behavior During Therapy [facility] for tasks assessed/performed           Past Medical History:  Diagnosis Date  . Anxiety   . Osteopenia   . Vision abnormalities     Past Surgical History:  Procedure Laterality Date  . DILATION AND CURETTAGE OF UTERUS      There were no vitals filed for this visit.   Subjective Assessment - 05/30/20 0926    Subjective I have had a really bad week with my anxiety and insomnia.  My leakage is worse when I'm really anxious.  I haven't been able to do the updated HEP due to my anxiety.    Pertinent History osteoporosis, anxiety    Diagnostic tests BMD test recently    Patient Stated Goals stop leaking    Currently in Pain? No/denies                             [facility] Adult PT Treatment/Exercise - 05/30/20 0001      Self-Care   Other Self-Care Comments  stress management tools, impact of stress on estrogen and cortisol levels       Lumbar Exercises: Aerobic   Elliptical L5 incline 5 x 6', PT present to review goals and progress      Lumbar Exercises: Sidelying   Clam Both;10 reps    Clam Limitations Reverse clam x 10    Other Sidelying Lumbar Exercises open books x 5 each way      Lumbar Exercises: Quadruped   Other Quadruped Lumbar Exercises SB to prayer bil 2x30 sec      Manual Therapy   Manual Therapy Myofascial release     Myofascial Release bil obliques, abdominal wall, pelvic and ribcage unilateral stretching each side                    PT Short Term Goals - 05/16/20 0756      PT SHORT TERM GOAL #1   Title ind with initial HEP    Status Achieved      PT SHORT TERM GOAL #2   Title Pt will learn about vulvar skin care and incorporate into behavior pattern changes/routine.    Status Achieved      PT SHORT TERM GOAL #3   Title Pt will practice diaphragmatic breathing and other techniques learned in PT to address pelvic tension and reduce overall anxiety at least 3x/week.             PT Long Term Goals - 04/12/20 1325      PT LONG TERM GOAL #1   Title Pt will be ind with behavioral strategies and HEP for pelvic strength, control, breathing and stress management techniques and understand how to safely progress.    Time 8  Period Weeks    Status New    Target Date 06/07/20      PT LONG TERM GOAL #2   Title Pt will demo at least 3/5 pelvic floor strength for improved control with urge.    Baseline squeeze no lift, downward bulge with quick flick attempt, overuse of abdominals and gluteals    Time 8    Period Weeks    Status New    Target Date 06/07/20      PT LONG TERM GOAL #3   Title Pt will report at least 70% improvement in leakage throughout the day.    Time 8    Period Weeks    Status New    Target Date 06/07/20      PT LONG TERM GOAL #4   Title Pt will be able to avoid need for bathroom use at least 3 nights out of the week for more restful night.    Time 8    Period Weeks    Status New    Target Date 06/07/20      PT LONG TERM GOAL #5   Title Pt will consistently achieve Marinoff Scale Score of </= 1/3 with use of self-massage, anxiety downtraining techniques and proper use of lubrication/moisturizers for vulvar skin care.    Time 8    Period Weeks    Status New    Target Date 06/07/20                 Plan - 05/30/20 1013    Clinical Impression  Statement Pt arrived in heightened state of stress x 1 week and poor sleep.  She leaks more when stressed. PT focused on fascial release bil throughout thoracic and lumbar region, stress management self-care tools and stretching wtih focused breath. Pt with much better state afterwards.  PT re-iterated need for stress management tool use at home and gave list of options again.  Pt has not been able to revisit HEP secondary to high anxiety/stress and HEP needs concentration.  She will benefit from ongoing self-care education, myofascial work, breathwork, stretching and neuro re-ed for PF strengh and control.    Comorbidities osteoporosis, anxiety    PT Frequency 1x / week    PT Duration 12 weeks    PT Treatment/Interventions ADLs/Self Care Home Management;Biofeedback;Electrical Stimulation;Moist Heat;Neuromuscular re-education;Therapeutic exercise;Patient/family education;Manual techniques;Spinal Manipulations;Joint Manipulations;Functional mobility training    PT Next Visit Plan myofascial trunk release, follow up on stress mngmt tools, breathing/stretching, PF strength/control in prone and SL    PT Home Exercise Plan behavioral changes with water intake and void timing, vulvar skin care, bladder log, puffer training in SL with self-palpation, diaphragmatic breathing    Consulted and Agree with Plan of Care Patient           Patient will benefit from skilled therapeutic intervention in order to improve the following deficits and impairments:     Visit Diagnosis: Muscle weakness (generalized)  Other lack of coordination     Problem List Patient Active Problem List   Diagnosis Date Noted  . Chronic insomnia 08/19/2018  . Generalized anxiety disorder 07/14/2018  . 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 Sciatica of left side without back pain 02/09/2015  . 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 Cervical pain 06/30/2014  . Essential (primary) hypertension 06/30/2014  . Disturbance  of skin sensation 06/28/2014  . Anxiety 05/05/2014  . 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 Hot flash, menopausal 05/05/2014    Morton Peters, PT 05/30/20  10:17 AM   [facility] [address] [place], [place], [address] Phone: [phone]   Fax:  [phone]  Name: Mariah [name] MRN: [id number] Date of Birth: 1961-06-28

## 2020-05-30 NOTE — Patient Instructions (Signed)
Stress Management and Relaxation Techniques There are two divisions in the nervous system that run many of our body's "behind the scenes" functions.  The "fight or flight" nervous system, and the "calming, rest and digest" nervous system.  These two systems have opposite effects on our body organs and systems and can impact our heart rate, blood pressure, breath rate, temperature, GI function, and experience of stress or calm.    Taking time to stimulate the "calming, rest and digest" part of our nervous system can help reduce experience of symptoms of chronic pain conditions, decrease stress and anxiety, and allow us to feel more equipped to handle challenges.  Below are strategies, techniques, and video suggestions to help stimulate this calming system.     Ways to Calm the Nervous System Yoga Meditation Mindfulness  Stretching Exercise Deep, slow breathing into belly (diaphragmatic breathing) with focused prolonged exhale Monotasking vs Multi-tasking (do one activity daily that is simple, focused, and slowly performed) Listen to your biorhythms: sleep when tired, rise when rested, eat when hungry, stop when full, etc Social connections - seek connections with others Laughter - laughing helps stimulate our "calming" nervous system Massage - by a practitioner or self-massage (example, feet for reflexology points) Singing or humming Cold exposure - try 30 sec of cold water at the end of your shower   Meditation, Yoga, Breathing, Stretching Video Suggestions [employer] YouTube Videos: Guided Meditation for Pelvic Floor Relaxation - [employer] YouTube video 10-Min Breath Meditation for Pelvic Health and Healing - [employer] YouTube video Pelvic Floor Release Stretches Bedtime Yoga for Pelvic Tension Pelvic Floor Release and Inner Thigh Stretch - Yoga for Pelvic Health (approx. 40 min) Progressive Muscle Relaxation Exercises - search Edmond Jacobson exercise videos on  YouTube Focused relaxation through guided relaxation, breathing, and contractions/relaxations of various muscle groups Autogenic Relaxation Technique - search videos on YouTube Uses body's natural relaxation response through guided meditation, inducing heaviness in body, and verbal stimuli/affirmations to create overall feeling of well-being, slowed breathing, reduced heart rate, reduced blood pressure, reduced stress/anxiety Sympathetic Breathing Meditation - search videos on YouTube Regulate the nervous system and restore calm through focused breathing to stimulate the parasympathetic nervous system (the opposite of our "fight or flight" sympathetic nervous system) Mindfulness Meditation - search videos on YouTube Focuses on choosing to be present in the moment, finding enjoyment in the now Diaphragmatic Breathing - search videos on YouTube Guided Imagery for Relaxation - search videos on YouTube  

## 2020-06-02 ENCOUNTER — Ambulatory Visit (INDEPENDENT_AMBULATORY_CARE_PROVIDER_SITE_OTHER): Payer: BC Managed Care – PPO | Admitting: Otolaryngology

## 2020-06-02 ENCOUNTER — Encounter (INDEPENDENT_AMBULATORY_CARE_PROVIDER_SITE_OTHER): Payer: Self-pay | Admitting: Otolaryngology

## 2020-06-02 ENCOUNTER — Other Ambulatory Visit: Payer: Self-pay

## 2020-06-02 VITALS — Temp 97.3°F

## 2020-06-02 DIAGNOSIS — H6123 Impacted cerumen, bilateral: Secondary | ICD-10-CM

## 2020-06-02 NOTE — Progress Notes (Signed)
HPI: Mariah [name] is a 59 y.o. female who presents for evaluation of wax buildup in her ears.  She was last seen here in 2017 because of wax buildup.  More recently has described blockage of hearing predominately in the right ear when she wakes up in the morning..  Past Medical History:  Diagnosis Date  . Anxiety   . Osteopenia   . Vision abnormalities    Past Surgical History:  Procedure Laterality Date  . DILATION AND CURETTAGE OF UTERUS     Social History   Socioeconomic History  . Marital status: Married    Spouse name: Not on file  . Number of children: Not on file  . Years of education: Not on file  . Highest education level: Not on file  Occupational History  . Not on file  Tobacco Use  . Smoking status: Former Smoker    Quit date: 10/02/1983    Years since quitting: 36.6  . Smokeless tobacco: Never Used  Vaping Use  . Vaping Use: Never used  Substance and Sexual Activity  . Alcohol use: No  . Drug use: No  . Sexual activity: Yes    Partners: Male    Comment: Vasectomy  Other Topics Concern  . Not on file  Social History Narrative  . Not on file   Social Determinants of Health   Financial Resource Strain:   . Difficulty of Paying Living Expenses: Not on file  Food Insecurity:   . Worried About Charity fundraiser in the Last Year: Not on file  . Ran Out of Food in the Last Year: Not on file  Transportation Needs:   . Lack of Transportation (Medical): Not on file  . Lack of Transportation (Non-Medical): Not on file  Physical Activity:   . Days of Exercise per Week: Not on file  . Minutes of Exercise per Session: Not on file  Stress:   . Feeling of Stress : Not on file  Social Connections:   . Frequency of Communication with Friends and Family: Not on file  . Frequency of Social Gatherings with Friends and Family: Not on file  . Attends Religious Services: Not on file  . Active Member of Clubs or Organizations: Not on file  . Attends Archivist  Meetings: Not on file  . Marital Status: Not on file   Family History  Problem Relation Age of Onset  . Hypertension Mother   . Skin cancer Mother   . Melanoma Father   . Multiple myeloma Father   . Anxiety disorder Daughter   . Depression Son    No Known Allergies Prior to Admission medications   Medication Sig Start Date End Date Taking? Authorizing Provider  Ascorbic Acid (VITA-C PO) Take by mouth.   Yes [provider]  CALCIUM PO Take by mouth.   Yes [provider]  estradiol (ESTRACE VAGINAL) 0.1 MG/GM vaginal cream 1 gram pv twice weeky 10/13/19  Yes Megan Salon, MD  LORazepam (ATIVAN) 0.5 MG tablet Take 1/2-1 tab po TID prn anxiety and insomnia 09/07/19  Yes [name], PMHNP  Multiple Vitamin (ONE DAILY) tablet Take by mouth.   Yes [provider]  Multiple Vitamins-Minerals (ZINC PO) Take by mouth.   Yes [provider]  Omega-3 Fatty Acids (FISH OIL PO) Take by mouth.   Yes [provider]  Theanine 50 MG TBDP Take by mouth.   Yes [provider]  TURMERIC PO Take by mouth.  Yes [provider]  VITAMIN D PO Take by mouth.   Yes [provider]     Positive ROS: Otherwise negative  All other systems have been reviewed and were otherwise negative with the exception of those mentioned in the HPI and as above.  Physical Exam: Constitutional: Alert, well-appearing, no acute distress Ears: External ears without lesions or tenderness. Ear canals are obstructed bilaterally right side worse than left.  This was cleaned with suction and forceps and curettes.  TMs were clear bilaterally and hearing is much better.. Nasal: External nose without lesions. Clear nasal passages Oral: Oropharynx clear. Neck: No palpable adenopathy or masses Respiratory: Breathing comfortably  Skin: No facial/neck lesions or rash noted.  Cerumen impaction removal  Date/Time: 06/02/2020 3:28 PM Performed by: Rozetta Nunnery, MD Authorized by: Rozetta Nunnery, MD   Consent:    Consent obtained:  Verbal   Consent given by:  Patient   Risks discussed:  Pain and bleeding Procedure details:    Location:  L ear and R ear   Procedure type: curette, suction and forceps   Post-procedure details:    Inspection:  TM intact and canal normal   Hearing quality:  Improved   Patient tolerance of procedure:  Tolerated well, no immediate complications Comments:     TMs are clear bilaterally    Assessment: Bilateral cerumen impactions right side worse than left  Plan: This was cleaned in the office with resolution of her symptoms. She will follow-up as needed  Radene Journey, MD

## 2020-06-08 ENCOUNTER — Encounter: Payer: Self-pay | Admitting: Physical Therapy

## 2020-06-08 ENCOUNTER — Other Ambulatory Visit: Payer: Self-pay

## 2020-06-08 ENCOUNTER — Ambulatory Visit: Payer: BC Managed Care – PPO | Attending: Obstetrics & Gynecology | Admitting: Physical Therapy

## 2020-06-08 DIAGNOSIS — R278 Other lack of coordination: Secondary | ICD-10-CM

## 2020-06-08 DIAGNOSIS — M6281 Muscle weakness (generalized): Secondary | ICD-10-CM | POA: Insufficient documentation

## 2020-06-08 NOTE — Therapy (Addendum)
[facility] [address], [place], [place], [address] Phone: [phone]   Fax:  [phone]  Physical Therapy Treatment  Patient Details  Name: Mariah [name] MRN: [id number] Date of Birth: 1960/10/28 Referring Provider (PT): Megan Salon, MD   Encounter Date: 06/08/2020   PT End of Session - 06/08/20 1020    Visit Number 7    Date for PT Re-Evaluation 06/07/20    Authorization Type BCBS    PT Start Time 1017    PT Stop Time 1102    PT Time Calculation (min) 45 min    Activity Tolerance Patient tolerated treatment well    Behavior During Therapy [facility] for tasks assessed/performed           Past Medical History:  Diagnosis Date  . Anxiety   . Osteopenia   . Vision abnormalities     Past Surgical History:  Procedure Laterality Date  . DILATION AND CURETTAGE OF UTERUS      There were no vitals filed for this visit.   Subjective Assessment - 06/08/20 1021    Subjective Having trouble getting into a routine of using my tools for stress management but when I do I sleep better and it really helps.    Pertinent History osteoporosis, anxiety    Diagnostic tests BMD test recently    Patient Stated Goals stop leaking    Currently in Pain? No/denies                             [facility] Adult PT Treatment/Exercise - 06/08/20 0001      Self-Care   Self-Care Other Self-Care Comments    Other Self-Care Comments  self-scan meditation x 2' supine legs propped      Neuro Re-ed    Neuro Re-ed Details  SL with PT TCs at abdomen and anal aperature, VCs for deep breaths to clear abdominal tension, then blueberry pick up and elevator climb floors 1-3 and eccentric 3-1  5 rounds      Lumbar Exercises: Aerobic   Elliptical L5 incline 5 x 5', PT present to review goals and progress      Knee/Hip Exercises: Standing   SLS with diagonal dumbbell 2# x 5 each      Knee/Hip Exercises: Sidelying   Clams red  band bil x 15, reverse band with ball between knees x 15 bil      Manual Therapy   Manual Therapy Myofascial release    Myofascial Release bil obliques, abdominal wall, pelvic and ribcage unilateral stretching each side                    PT Short Term Goals - 06/08/20 1022      PT SHORT TERM GOAL #1   Title ind with initial HEP    Status Achieved      PT SHORT TERM GOAL #2   Title Pt will learn about vulvar skin care and incorporate into behavior pattern changes/routine.    Status Achieved      PT SHORT TERM GOAL #3   Title Pt will practice diaphragmatic breathing and other techniques learned in PT to address pelvic tension and reduce overall anxiety at least 3x/week.    Status Achieved      PT SHORT TERM GOAL #4   Title Pt will improve ability to perform quick flicks with upward lift vs downward bulge with effort.  Status On-going      PT SHORT TERM GOAL #5   Title Pt will report reduced leakage with urge by at least 20%    Status Achieved             PT Long Term Goals - 04/12/20 1325      PT LONG TERM GOAL #1   Title Pt will be ind with behavioral strategies and HEP for pelvic strength, control, breathing and stress management techniques and understand how to safely progress.    Time 8    Period Weeks    Status New    Target Date 06/07/20      PT LONG TERM GOAL #2   Title Pt will demo at least 3/5 pelvic floor strength for improved control with urge.    Baseline squeeze no lift, downward bulge with quick flick attempt, overuse of abdominals and gluteals    Time 8    Period Weeks    Status New    Target Date 06/07/20      PT LONG TERM GOAL #3   Title Pt will report at least 70% improvement in leakage throughout the day.    Time 8    Period Weeks    Status New    Target Date 06/07/20      PT LONG TERM GOAL #4   Title Pt will be able to avoid need for bathroom use at least 3 nights out of the week for more restful night.    Time 8    Period  Weeks    Status New    Target Date 06/07/20      PT LONG TERM GOAL #5   Title Pt will consistently achieve Marinoff Scale Score of </= 1/3 with use of self-massage, anxiety downtraining techniques and proper use of lubrication/moisturizers for vulvar skin care.    Time 8    Period Weeks    Status New    Target Date 06/07/20                 Plan - 06/08/20 1125    Clinical Impression Statement Pt is partially compliant with HEP and self-care tools which do help her symptoms and impact of her anxiety and stress on her pelvic floor symptoms.  She has met almost all STGs.  She reports at least 20% reduction in leakage with urge.  She continues to have difficulty finding a diaphragmatic breath and creates a bulge vs lift of her pelvic floor.  PT used VCs, imagery and slower recruitment stages in SL today which yielded more lift vs bulge.  Updated HEP for this along with clam and reverse clams today.  Pt carries much tension and stress in abdominal wall and pelvis which is released and relieved with meditation and STM.  She will continue to benefit from skilled PT along POC.    Comorbidities osteoporosis, anxiety    Rehab Potential Good    PT Frequency 1x / week    PT Duration 12 weeks    PT Treatment/Interventions ADLs/Self Care Home Management;Biofeedback;Electrical Stimulation;Moist Heat;Neuromuscular re-education;Therapeutic exercise;Patient/family education;Manual techniques;Spinal Manipulations;Joint Manipulations;Functional mobility training    PT Next Visit Plan clam red band, reverse clam, SL blueberry hover and slow elevator ride floors 1-3 then 3-1 for eccentric, continue to work on diaphragmatic breath - try over ball    PT Home Exercise Plan behavioral changes with water intake and void timing, vulvar skin care, bladder log, puffer training in SL with self-palpation, diaphragmatic breathing  Consulted and Agree with Plan of Care Patient           Patient will benefit from  skilled therapeutic intervention in order to improve the following deficits and impairments:     Visit Diagnosis: Muscle weakness (generalized)  Other lack of coordination     Problem List Patient Active Problem List   Diagnosis Date Noted  . Chronic insomnia 08/19/2018  . Generalized anxiety disorder 07/14/2018  . 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 Sciatica of left side without back pain 02/09/2015  . 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 Cervical pain 06/30/2014  . Essential (primary) hypertension 06/30/2014  . Disturbance of skin sensation 06/28/2014  . Anxiety 05/05/2014  . 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 Hot flash, menopausal 05/05/2014    Myleah Cavendish, PT 06/08/20 11:32 AM  PHYSICAL THERAPY DISCHARGE SUMMARY  Visits from Start of Care: 7  Current functional level related to goals / functional outcomes: Pt missed last scheduled appointment due to sickness and is now going out of town.  She feels she wants to use her tools/exercises she learned in PT when she returns from her travels to manage symptoms.  She understands she will need a new PT Rx should she wish to start back up again with pelvic PT.   Remaining deficits: See above   Education / Equipment: HEP, stress management tools, nervous system downtraining Plan: Patient agrees to discharge.  Patient goals were partially met. Patient is being discharged due to being pleased with the current functional level.  ?????         Baruch Merl, PT 06/20/20 12:56 PM   [facility] [address] [place], [place], [address] Phone: [phone]   Fax:  [phone]  Name: Mariah [name] MRN: [id number] Date of Birth: Feb 13, 1961

## 2020-06-13 ENCOUNTER — Ambulatory Visit: Payer: BC Managed Care – PPO | Admitting: Physical Therapy

## 2020-06-15 ENCOUNTER — Ambulatory Visit: Payer: BC Managed Care – PPO | Admitting: Psychiatry

## 2020-06-20 ENCOUNTER — Encounter: Payer: BC Managed Care – PPO | Admitting: Physical Therapy

## 2020-07-08 ENCOUNTER — Encounter: Payer: Self-pay | Admitting: Obstetrics & Gynecology

## 2020-08-30 ENCOUNTER — Telehealth: Payer: Self-pay | Admitting: Obstetrics & Gynecology

## 2020-08-30 NOTE — Telephone Encounter (Signed)
Pt sent mychart message with medical questions about possibly having a gluten sensitivity.  Pt has called to rechedule appt to [facility] location.  Pt's question involved testing for GI sensitivities/allergies.  She has recently seen Dr. Matthias Hughs for a screening colonoscopy.   She didn't really discuss this with him.  Reports colonoscopy was normal and follow up 10 years recommended.  She is wondering if she has a gluten sensitivity/allergy and is wondering about testing.  Recommended she call Dr. Donavan Burnet office for this as serology testing for gluten allergy may not be all she needs to do.  Pt comfortable with this and will call.

## 2020-12-12 ENCOUNTER — Encounter (HOSPITAL_BASED_OUTPATIENT_CLINIC_OR_DEPARTMENT_OTHER): Payer: Self-pay

## 2021-02-14 ENCOUNTER — Ambulatory Visit (HOSPITAL_BASED_OUTPATIENT_CLINIC_OR_DEPARTMENT_OTHER): Payer: BC Managed Care – PPO | Admitting: Obstetrics & Gynecology

## 2021-02-15 ENCOUNTER — Encounter (HOSPITAL_BASED_OUTPATIENT_CLINIC_OR_DEPARTMENT_OTHER): Payer: Self-pay | Admitting: Obstetrics & Gynecology

## 2021-02-15 ENCOUNTER — Ambulatory Visit (INDEPENDENT_AMBULATORY_CARE_PROVIDER_SITE_OTHER): Payer: BC Managed Care – PPO | Admitting: Obstetrics & Gynecology

## 2021-02-15 ENCOUNTER — Other Ambulatory Visit: Payer: Self-pay

## 2021-02-15 VITALS — BP 142/88 | HR 78 | Ht 64.25 in | Wt 118.0 lb

## 2021-02-15 DIAGNOSIS — F411 Generalized anxiety disorder: Secondary | ICD-10-CM

## 2021-02-15 DIAGNOSIS — Z01419 Encounter for gynecological examination (general) (routine) without abnormal findings: Secondary | ICD-10-CM

## 2021-02-15 DIAGNOSIS — Z780 Asymptomatic menopausal state: Secondary | ICD-10-CM | POA: Diagnosis not present

## 2021-02-15 DIAGNOSIS — N952 Postmenopausal atrophic vaginitis: Secondary | ICD-10-CM

## 2021-02-15 DIAGNOSIS — M810 Age-related osteoporosis without current pathological fracture: Secondary | ICD-10-CM

## 2021-02-15 NOTE — Patient Instructions (Addendum)
Oscal or Caltrate Vit D 1000 - 2000 IU

## 2021-02-15 NOTE — Progress Notes (Signed)
60 y.o. G48P2002 Married White or Caucasian female here for annual exam.  Had Covid.  Had some memory issues afterwards.  Feels she is improved but this was worrisome for her.  Going to have her first booster.  Denies vaginal bleeding.  Not using vaginal estrogen cream.  Did see Venetia Night Beuring, pelvic PT.  Thinks this helped some.    Patient's last menstrual period was 07/14/2011.          Sexually active: Yes.    The current method of family planning is post menopausal status.    Exercising: Yes.     Smoker:  no  Health Maintenance: Pap:  Neg with HR HPV neg 10/2019 MMG:  03/2020 Colonoscopy:  Dr. Cristina Gong BMD:   03/2020, -2.7 TDaP:  11/2012 Pneumonia vaccine(s):  Not due Shingrix:   declines Hep C testing: 10/2019 Screening Labs: 09/2019 and 10/2019   reports that she quit smoking about 37 years ago. She has never used smokeless tobacco. She reports that she does not drink alcohol and does not use drugs.  Past Medical History:  Diagnosis Date  . Anxiety   . Osteopenia   . Vision abnormalities     Past Surgical History:  Procedure Laterality Date  . DILATION AND CURETTAGE OF UTERUS      Current Outpatient Medications  Medication Sig Dispense Refill  . Ascorbic Acid (VITA-C PO) Take by mouth.    Marland Kitchen CALCIUM PO Take by mouth.    . estradiol (ESTRACE VAGINAL) 0.1 MG/GM vaginal cream 1 gram pv twice weeky 42.5 g 4  . LORazepam (ATIVAN) 0.5 MG tablet Take 1/2-1 tab po TID prn anxiety and insomnia 90 tablet 0  . Multiple Vitamin (ONE DAILY) tablet Take by mouth.    . Multiple Vitamins-Minerals (ZINC PO) Take by mouth.    . Omega-3 Fatty Acids (FISH OIL PO) Take by mouth.    . Theanine 50 MG TBDP Take by mouth.    . TURMERIC PO Take by mouth.    Marland Kitchen VITAMIN D PO Take by mouth.     No current facility-administered medications for this visit.    Family History  Problem Relation Age of Onset  . Hypertension Mother   . Skin cancer Mother   . Melanoma Father   . Multiple myeloma  Father   . Anxiety disorder Daughter   . Depression Son     Review of Systems  All other systems reviewed and are negative.   Exam:   BP (!) 142/88   Pulse 78   Ht 5' 4.25" (1.632 m)   Wt 118 lb (53.5 kg)   LMP 07/14/2011   BMI 20.10 kg/m   Height: 5' 4.25" (163.2 cm)  General appearance: alert, cooperative and appears stated age Head: Normocephalic, without obvious abnormality, atraumatic Neck: no adenopathy, supple, symmetrical, trachea midline and thyroid normal to inspection and palpation Lungs: clear to auscultation bilaterally Breasts: normal appearance, no masses or tenderness Heart: regular rate and rhythm Abdomen: soft, non-tender; bowel sounds normal; no masses,  no organomegaly Extremities: extremities normal, atraumatic, no cyanosis or edema Skin: Skin color, texture, turgor normal. No rashes or lesions Lymph nodes: Cervical, supraclavicular, and axillary nodes normal. No abnormal inguinal nodes palpated Neurologic: Grossly normal   Pelvic: External genitalia:  no lesions              Urethra:  normal appearing urethra with no masses, tenderness or lesions              Bartholins  and Skenes: normal                 Vagina: normal appearing vagina with normal color and no discharge, no lesions              Cervix: no lesions              Pap taken: No. Bimanual Exam:  Uterus:  normal size, contour, position, consistency, mobility, non-tender              Adnexa: normal adnexa and no mass, fullness, tenderness               Rectovaginal: Confirms               Anus:  normal sphincter tone, no lesions  Chaperone, Octaviano Batty, CMA, was present for exam.  Assessment/Plan: 1. Well woman exam with routine gynecological exam - pap neg with neg HR HPV 10/2019.  Not indicated today. - MMG 03/2020 - colonoscopy done with Dr. Cristina Gong in last year.  Will have release signed. - BMD 03/2020 - vaccines updated - lab work done with PCP  2. Postmenopausal - no HRT  3.  Vaginal atrophy - not using vaginal estrogen.  Vaginal hyaluronic acid ([place]) discussed with pt as well as mona lisa touch.  She will research and let me know if has questions  4. Generalized anxiety disorder - declines medications  5. Age-related osteoporosis without current pathological fracture - declines treatment right now.  Will plan to repeat next year.  If worse, will then discuss treatment with pt.

## 2021-02-23 ENCOUNTER — Ambulatory Visit: Payer: BC Managed Care – PPO

## 2021-02-23 NOTE — Progress Notes (Signed)
   Covid-19 Vaccination Clinic  Name:  Mariah [name]    MRN: [id number] DOB: June 02, 1961  02/23/2021  Mariah [name] was observed post Covid-19 immunization for 15 minutes without incident. She was provided with Vaccine Information Sheet and instruction to access the V-Safe system.   Mariah [name] was instructed to call 911 with any severe reactions post vaccine: Marland Kitchen Difficulty breathing  . Swelling of face and throat  . A fast heartbeat  . A bad rash all over body  . Dizziness and weakness

## 2021-03-03 ENCOUNTER — Other Ambulatory Visit (HOSPITAL_BASED_OUTPATIENT_CLINIC_OR_DEPARTMENT_OTHER): Payer: Self-pay

## 2021-03-03 MED ORDER — PFIZER-BIONT COVID-19 VAC-TRIS 30 MCG/0.3ML IM SUSP
INTRAMUSCULAR | 0 refills | Status: DC
  Filled 2021-03-03: qty 0.3, 1d supply, fill #0

## 2021-03-07 ENCOUNTER — Encounter (HOSPITAL_BASED_OUTPATIENT_CLINIC_OR_DEPARTMENT_OTHER): Payer: Self-pay | Admitting: Obstetrics & Gynecology

## 2021-03-16 ENCOUNTER — Ambulatory Visit: Payer: BC Managed Care – PPO | Admitting: Psychiatry

## 2021-03-16 ENCOUNTER — Other Ambulatory Visit: Payer: Self-pay

## 2021-03-16 DIAGNOSIS — F411 Generalized anxiety disorder: Secondary | ICD-10-CM

## 2021-03-16 NOTE — Progress Notes (Signed)
[facility] Counselor/Therapist Progress Note  Patient ID: Mariah [name], MRN: [id number],    Date: 03/16/2021  Time Spent: 67 minutes start time 11:01 AM end time 12:08 pm  Treatment Type: Individual Therapy  Reported Symptoms: anxiety, sleep issues, panic, sadness, fatigue  Mental Status Exam:  Appearance:   Well Groomed     Behavior:  Appropriate  Motor:  Normal  Speech/Language:   Normal Rate  Affect:  Appropriate  Mood:  anxious  Thought process:  normal  Thought content:    WNL  Sensory/Perceptual disturbances:    WNL  Orientation:  oriented to person, place, time/date, and situation  Attention:  Good  Concentration:  Good  Memory:  WNL  Fund of knowledge:   Good  Insight:    Good  Judgment:   Good  Impulse Control:  Good   Risk Assessment: Danger to Self:  No Self-injurious Behavior: No Danger to Others: No Duty to Warn:no Physical Aggression / Violence:No  Access to Firearms a concern: No  Gang Involvement:No   Subjective: Patient was present for session.  She shared that her anxiety has increased recently and it got to a point when she had to cancel a trip out of the country.  She shared she broke her arm playing pickle ball and it has made it hard for her to exercise and do what she wants to do which has increased her anxiety. She shared that she feels the stress is aging her. She had a sleep study but nothing was found except her anxiety.  She shared that her mother told her that when she retired at 89 she put her on Paxil to help her deal with her sister full time.  Her sister was put on the behavioral health unit 35 years ago. She shared that her sister has always struggled with not being able to have a normal life due to having special needs. She went on to share that during the pandemic her mother's PCP had started her on Xanax so she took her to a psychiatrist to help her get back stable.  She went on to share that she is seeing her daughter  struggle like  her sister did due to friends getting married and moving forward with life and she isn't. She shared that her daughter is struggling still with her mental health and patient stated it is exhausting for her.  Patient was allowed time to process the different things going on with her daughter and how powerless she feels about the whole situation.  Her daughter keeps going back to things from her childhood that happened that patient feels she is angry at her for even though she had handled things the best way she could at the time.  Also her daughter had seemed to work through the things over the past couple of years and now it seems to be an issue again.  Patient shared she is very concerned because there is an upcoming trip with the entire family and she is not sure how to handle things or how to make sure that her daughter is okay.  Brainstormed different potential ideas with patient.  Discussed the importance of finding ways for patient and her daughter to release the negative emotions appropriately as well as to keep their brain engaged in positive activities.  Patient and clinician were able to come up with some ideas that she could try and implement during the family vacation.  Also encouraged patient to remind herself that  she has to focus on the things that she can control fix and change and that her daughter is not 1 of those things.  Patient was reminded the importance of working on her spirituality as well as her physical health.  Patient explained that her broken arm is making it very difficult because she cannot do her yoga or other activities that she typically does to relieve stress.  Encouraged her to get back to taking things 1 step at a time and she can at least focus on walking and hiking.  Interventions: Cognitive Behavioral Therapy and Solution-Oriented/Positive Psychology  Diagnosis:   ICD-10-CM   1. Generalized anxiety disorder  F41.1       Plan: Patient is to use CBT and coping skills  to decrease anxiety symptoms.  Patient is to work on plans from session to help she and her daughter release negative emotions appropriately while on their family vacation.  Patient is to work on hiking and walking to release negative emotions.  Patient is to consider medication for her anxiety and to help with sleep. Long-term goal: Enhance ability to handle effectively the full variety of life's anxieties Short-term goal: Identify the major life complex in the past and present the form the basis for present anxiety.  Verbalize an understanding of the role that fearful thinking plays in creating fears excessive worry and persistent anxiety  [name], [facility]

## 2021-04-04 ENCOUNTER — Encounter (HOSPITAL_BASED_OUTPATIENT_CLINIC_OR_DEPARTMENT_OTHER): Payer: Self-pay

## 2021-04-05 ENCOUNTER — Other Ambulatory Visit (HOSPITAL_BASED_OUTPATIENT_CLINIC_OR_DEPARTMENT_OTHER): Payer: Self-pay | Admitting: Obstetrics & Gynecology

## 2021-04-05 DIAGNOSIS — R42 Dizziness and giddiness: Secondary | ICD-10-CM

## 2021-04-11 ENCOUNTER — Ambulatory Visit: Payer: BC Managed Care – PPO | Attending: Obstetrics & Gynecology | Admitting: Physical Therapy

## 2021-04-11 ENCOUNTER — Encounter: Payer: Self-pay | Admitting: Physical Therapy

## 2021-04-11 ENCOUNTER — Other Ambulatory Visit: Payer: Self-pay

## 2021-04-11 DIAGNOSIS — R42 Dizziness and giddiness: Secondary | ICD-10-CM | POA: Diagnosis present

## 2021-04-11 DIAGNOSIS — H8112 Benign paroxysmal vertigo, left ear: Secondary | ICD-10-CM | POA: Insufficient documentation

## 2021-04-11 NOTE — Patient Instructions (Signed)
How to Perform the Epley Maneuver The Epley maneuver is an exercise that relieves symptoms of vertigo. Vertigo is the feeling that you or your surroundings are moving when they are not. When you feel vertigo, you may feel like the room is spinning and may have trouble walking. The Epley maneuver is used for a type of vertigo caused by a calcium deposit in a part of the inner ear. The maneuver involves changing headpositions to help the deposit move out of the area. You can do this maneuver at home whenever you have symptoms of vertigo. You canrepeat it in 24 hours if your vertigo has not gone away. Even though the Epley maneuver may relieve your vertigo for a few weeks, it is possible that your symptoms will return. This maneuver relieves vertigo, but itdoes not relieve dizziness. What are the risks? If it is done correctly, the Epley maneuver is considered safe. Sometimes it can lead to dizziness or nausea that goes away after a short time. If you develop other symptoms--such as changes in vision, weakness, or numbness--stopdoing the maneuver and call your health care provider. Supplies needed: A bed or table. A pillow. How to do the Epley maneuver     Sit on the edge of a bed or table with your back straight and your legs extended or hanging over the edge of the bed or table. Turn your head halfway toward the affected ear or side as told by your health care provider. Lie backward quickly with your head turned until you are lying flat on your back. Your head should dangle (head-hanging position). You may want to position a pillow under your shoulders. Hold this position for at least 30 seconds. If you feel dizzy or have symptoms of vertigo, continue to hold the position until the symptoms stop. Turn your head to the opposite direction until your unaffected ear is facing down. Your head should continue to dangle. Hold this position for at least 30 seconds. If you feel dizzy or have symptoms of  vertigo, continue to hold the position until the symptoms stop. Turn your whole body to the same side as your head so that you are positioned on your side. Your head will now be nearly facedown and no longer needs to dangle. Hold for at least 30 seconds. If you feel dizzy or have symptoms of vertigo, continue to hold the position until the symptoms stop. Sit back up. You can repeat the maneuver in 24 hours if your vertigo does not go away. Follow these instructions at home: For 24 hours after doing the Epley maneuver: Keep your head in an upright position. When lying down to sleep or rest, keep your head raised (elevated) with two or more pillows. Avoid excessive neck movements. Activity Do not drive or use machinery if you feel dizzy. After doing the Epley maneuver, return to your normal activities as told by your health care provider. Ask your health care provider what activities are safe for you. General instructions Drink enough fluid to keep your urine pale yellow. Do not drink alcohol. Take over-the-counter and prescription medicines only as told by your health care provider. Keep all follow-up visits. This is important. Preventing vertigo symptoms Ask your health care provider if there is anything you should do at home to prevent vertigo. He or she may recommend that you: Keep your head elevated with two or more pillows while you sleep. Do not sleep on the side of your affected ear. Get up slowly from bed.  Avoid sudden movements during the day. Avoid extreme head positions or movement, such as looking up or bending over. Contact a health care provider if: Your vertigo gets worse. You have other symptoms, including: Nausea. Vomiting. Headache. Get help right away if you: Have vision changes. Have a headache or neck pain that is severe or getting worse. Cannot stop vomiting. Have new numbness or weakness in any part of your body. These symptoms may represent a serious problem  that is an emergency. Do not wait to see if the symptoms will go away. Get medical help right away. Call your local emergency services (911 in the [place] Do not drive yourself to the hospital. Summary Vertigo is the feeling that you or your surroundings are moving when they are not. The Epley maneuver is an exercise that relieves symptoms of vertigo. If the Epley maneuver is done correctly, it is considered safe. This information is not intended to replace advice given to you by your health care provider. Make sure you discuss any questions you have with your healthcare provider. Document Revised: 08/17/2020 Document Reviewed: 08/17/2020 Elsevier Patient Education  2022 [employer]vier Inc.     Self Treatment for Left Posterior / Anterior Canalithiasis    Sitting on bed: 1. Turn head 45 left. (a) Lie back slowly, shoulders on pillow, head on bed. (b) Hold ____ seconds. 2. Keeping head on bed, turn head 90 right. Hold ____ seconds. 3. Roll to right, head on 45 angle down toward bed. Hold ____ seconds. 4. Sit up on right side of bed. Repeat ____ times per session. Do ____ sessions per day.  Copyright  VHI. All rights reserved.

## 2021-04-12 NOTE — Therapy (Signed)
[facility] [address] [place], [place], [address] Phone: [phone]   Fax:  [phone]  Physical Therapy Evaluation  Patient Details  Name: Mariah [name] MRN: [id number] Date of Birth: 05-20-1961 Referring Provider (PT): Valentina Shaggy, MD   Encounter Date: 04/11/2021   PT End of Session - 04/12/21 1346     Visit Number 1    Authorization Type BCBS    PT Start Time 0932    PT Stop Time 1016    PT Time Calculation (min) 44 min    Activity Tolerance Patient tolerated treatment well    Behavior During Therapy [facility] for tasks assessed/performed             Past Medical History:  Diagnosis Date   Anxiety    Osteopenia    Vision abnormalities     Past Surgical History:  Procedure Laterality Date   DILATION AND CURETTAGE OF UTERUS      There were no vitals filed for this visit.    Subjective Assessment - 04/11/21 0939     Subjective Pt states she woke up with dizziness last Tuesday - took Dramamine and the spinning stopped; did not have nausea or vomiting with it; stayed in bed rest of that day.  Reports she had no energy for 5 days - felt like she had a virus but had no symptoms of being sick.  Pt states she is better than what it was  - states she drove today; anxiety increased with driving on [address] this morning to get to this appt.    Patient Stated Goals "resolve the vertigo and understand what I need to do if it happens again"    Currently in Pain? No/denies                [facility] PT Assessment - 04/12/21 0001       Assessment   Medical Diagnosis Vertigo    Referring Provider (PT) Valentina Shaggy, MD    Onset Date/Surgical Date 04/04/21    Prior Therapy None      Precautions   Precautions None      Balance Screen   Has the patient fallen in the past 6 months No    Has the patient had a decrease in activity level because of a fear of falling?  No    Is the patient reluctant to leave their home  because of a fear of falling?  No                    Vestibular Assessment - 04/12/21 0001       Symptom Behavior   Subjective history of current problem pt states she woke up with vertigo last Tues. AM - did some exercise that her friend told her to do; pt states vertigo has been much better past few days    Type of Dizziness  Spinning    Frequency of Dizziness depends on the movement    Duration of Dizziness secs to minutes    Symptom Nature Positional    Aggravating Factors Sitting with head tilted back;Rolling to right;Rolling to left    Relieving Factors Lying supine;Head stationary    Progression of Symptoms Better    History of similar episodes None      Positional Testing   Dix-Hallpike Dix-Hallpike Right;Dix-Hallpike Left    Sidelying Test Sidelying Right;Sidelying Left      Dix-Hallpike Right   Dix-Hallpike Right Duration none    Dix-Hallpike Right  Symptoms No nystagmus      Dix-Hallpike Left   Dix-Hallpike Left Duration none    Dix-Hallpike Left Symptoms No nystagmus      Sidelying Right   Sidelying Right Duration none    Sidelying Right Symptoms No nystagmus      Sidelying Left   Sidelying Left Duration none    Sidelying Left Symptoms No nystagmus                Objective measurements completed on examination: See above findings.               PT Education - 04/12/21 1345     Education Details educated pt in Epley maneuver for self treatment prn    Person(s) Educated Patient    Methods Explanation;Demonstration;Handout    Comprehension Verbalized understanding;Returned demonstration                 PT Long Term Goals - 04/12/21 1350       PT LONG TERM GOAL #1   Title N/A - eval only                    Plan - 04/12/21 1346     Clinical Impression Statement Pt has no signs or symptoms of BPPV at this time; no nystagmus noted with any positional testing.  Pt's symptoms are consistent with BPPV that has  currently resolved.  Pt was instructed in Epley maneuver for self treatment prn.  No treatment warranted as no c/o dizziness at this time.    Personal Factors and Comorbidities Comorbidity 1    Examination-Activity Limitations Locomotion Level;Reach Overhead;Bend;Transfers    Stability/Clinical Decision Making Stable/Uncomplicated    Clinical Decision Making Low    PT Frequency One time visit    PT Treatment/Interventions ADLs/Self Care Home Management    PT Next Visit Plan N/A - eval only    Consulted and Agree with Plan of Care Patient             Patient will benefit from skilled therapeutic intervention in order to improve the following deficits and impairments:  Dizziness  Visit Diagnosis: Dizziness and giddiness - Plan: PT plan of care cert/re-cert  BPPV (benign paroxysmal positional vertigo), left - Plan: PT plan of care cert/re-cert     Problem List Patient Active Problem List   Diagnosis Date Noted   Chronic insomnia 08/19/2018   Generalized anxiety disorder 07/14/2018   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 [place], [place], [address] Phone: [phone]   Fax:  [phone]  Name: Mariah [name] MRN: [id number] Date of Birth: 1961-05-01

## 2021-04-19 ENCOUNTER — Ambulatory Visit: Payer: BC Managed Care – PPO | Admitting: Psychiatry

## 2021-04-19 ENCOUNTER — Other Ambulatory Visit: Payer: Self-pay

## 2021-04-19 DIAGNOSIS — F411 Generalized anxiety disorder: Secondary | ICD-10-CM | POA: Diagnosis not present

## 2021-04-19 NOTE — Progress Notes (Signed)
[facility] Counselor/Therapist Progress Note  Patient ID: Mariah [name], MRN: [id number],    Date: 04/19/2021  Time Spent: 52 minutes start time 3:06 PM end 3:58 PM  Treatment Type: Individual Therapy  Reported Symptoms: anxiety, sadness, sleep issues, physical issues, anger  Mental Status Exam:  Appearance:   Well Groomed     Behavior:  Appropriate  Motor:  Normal  Speech/Language:   Normal Rate  Affect:  Appropriate  Mood:  anxious  Thought process:  normal  Thought content:    WNL  Sensory/Perceptual disturbances:    WNL  Orientation:  oriented to person, place, time/date, and situation  Attention:  Good  Concentration:  Good  Memory:  WNL  Fund of knowledge:   Good  Insight:    Good  Judgment:   Good  Impulse Control:  Good   Risk Assessment: Danger to Self:  No Self-injurious Behavior: No Danger to Others: No Duty to Warn:no Physical Aggression / Violence:No  Access to Firearms a concern: No  Gang Involvement:No   Subjective: Patient was present for session.  She shared that things have been up and down.  She is still having issues with her arm.  She had vertigo and that was hard.  She was concerned that the stress of her family being together over the 4th of July caused it. Patient shared she is going to have to go with her daughter to counseling and she is not sure what will happen.  Patient explained that the things that cause her the most stress are the things she deals with in her everyday week. She shared that there is still lots of stress with her husband who is working from home most of the time since COVID and has to deal with his mother who is difficult.  Patient explained that her biggest stressor currently is having to go to her daughter's therapist.  Patient explained she is fearful that some of the stress and frustration she is feeling towards her husband is impacting her daughter negatively.  Patient discussed the different things she is already trying  to make things better with her husband and the difficulty she is seeing and things moving in a better direction.  Patient was encouraged to recognize her husband's strengths as she did that she also recognized some of those strengths were the things that bother her.  Discussed the importance of working on her communication with him about the things that she loves and the things that she wants to change about their relationship.  Discussed different questions and different ways that she can help him try and understand what she is feeling appropriately.  Interventions: Cognitive Behavioral Therapy, Solution-Oriented/Positive Psychology, and Insight-Oriented  Diagnosis:   ICD-10-CM   1. Generalized anxiety disorder  F41.1       Plan: Patient is to use CBT and coping skills to decrease anxiety symptoms.  Patient is to continue trying to find ways to release negative emotions appropriately.  Patient is to continue working with the orthopedics to get her arm healed so she can get back to regular exercising.  Patient is to follow plans from session on how to talk with her husband about her concerns. Long-term goal: Enhance ability to handle effectively the full variety of life's anxieties Short-term goal: Identify the major life complex in the past and present the form the basis for present anxiety.  Verbalize an understanding of the role that fearful thinking plays in creating fears excessive worry and persistent  anxiety  Mariah [name], [facility]

## 2021-04-26 ENCOUNTER — Ambulatory Visit: Payer: BC Managed Care – PPO | Admitting: Psychiatry

## 2021-04-27 ENCOUNTER — Other Ambulatory Visit (HOSPITAL_BASED_OUTPATIENT_CLINIC_OR_DEPARTMENT_OTHER): Payer: Self-pay | Admitting: Obstetrics & Gynecology

## 2021-04-27 DIAGNOSIS — Z1231 Encounter for screening mammogram for malignant neoplasm of breast: Secondary | ICD-10-CM

## 2021-05-22 ENCOUNTER — Other Ambulatory Visit: Payer: Self-pay

## 2021-05-22 ENCOUNTER — Ambulatory Visit (HOSPITAL_BASED_OUTPATIENT_CLINIC_OR_DEPARTMENT_OTHER)
Admission: RE | Admit: 2021-05-22 | Discharge: 2021-05-22 | Disposition: A | Discharge status: Home / Self Care | Payer: BC Managed Care – PPO | Source: Ambulatory Visit | Attending: Obstetrics & Gynecology | Admitting: Obstetrics & Gynecology

## 2021-05-22 ENCOUNTER — Encounter (HOSPITAL_BASED_OUTPATIENT_CLINIC_OR_DEPARTMENT_OTHER): Payer: Self-pay

## 2021-05-22 DIAGNOSIS — Z1231 Encounter for screening mammogram for malignant neoplasm of breast: Secondary | ICD-10-CM | POA: Insufficient documentation

## 2021-06-12 ENCOUNTER — Other Ambulatory Visit (HOSPITAL_BASED_OUTPATIENT_CLINIC_OR_DEPARTMENT_OTHER): Payer: Self-pay

## 2021-07-06 ENCOUNTER — Other Ambulatory Visit: Payer: Self-pay

## 2021-07-06 ENCOUNTER — Ambulatory Visit (INDEPENDENT_AMBULATORY_CARE_PROVIDER_SITE_OTHER): Payer: BC Managed Care – PPO | Admitting: Psychiatry

## 2021-07-06 DIAGNOSIS — F411 Generalized anxiety disorder: Secondary | ICD-10-CM

## 2021-07-06 NOTE — Progress Notes (Signed)
      [facility] Counselor/Therapist Progress Note  Patient ID: Mariah [name], MRN: [id number],    Date: 07/06/2021  Time Spent: 50 minutes start time 11:12 AM end time 12:02 PM  Treatment Type: Individual Therapy  Reported Symptoms: anxiety,sleep issues, rumination, muscle tension, sadness  Mental Status Exam:  Appearance:   Well Groomed     Behavior:  Appropriate  Motor:  Normal  Speech/Language:   Normal Rate  Affect:  Appropriate  Mood:  anxious  Thought process:  normal  Thought content:    WNL  Sensory/Perceptual disturbances:    WNL  Orientation:  oriented to person, place, time/date, and situation  Attention:  Good  Concentration:  Good  Memory:  WNL  Fund of knowledge:   Good  Insight:    Good  Judgment:   Good  Impulse Control:  Good   Risk Assessment: Danger to Self:  No Self-injurious Behavior: No Danger to Others: No Duty to Warn:no Physical Aggression / Violence:No  Access to Firearms a concern: No  Gang Involvement:No   Subjective: Patient was present for session.  She shared she and her husband went to [place] and it was a great trip. Her son and his girl friend came as well and that was good.  Patient reported she sleeps when she is away but when she she is home the stress of her mother in law, husband, children, and her mother and sister impact her sleep negatively.  Patient went on to share she is still trying to figure out how to have appropriate boundaries with her daughter and is working towards that.  She shared the biggest issue overall still is her husband and she wants to try and figure out how to manage things.  Patient was allowed time to discuss the situation and encouraged to remember to focus on the things that she can control fix and change.  Patient was encouraged to work on her self-care and to recognize she has to let through his issues.  Patient will discuss the situation with her daughter further at next session.  Interventions: Cognitive  Behavioral Therapy and Solution-Oriented/Positive Psychology  Diagnosis:   ICD-10-CM   1. Generalized anxiety disorder  F41.1       Plan: Patient is to use CBT and coping skills to decrease anxiety symptoms.  Patient is to continue working on healthy sleep habits to improve sleep and decrease anxiety.  Patient is to focus on the things that she can control fix and change.  Patient is to work with providers on medical issues. Long-term goal: Enhance ability to handle effectively the full variety of life's anxieties Short-term goal: Identify the major life complex in the past and present the form the basis for present anxiety.  Verbalize an understanding of the role that fearful thinking plays in creating fears excessive worry and persistent anxiety  [name], [facility]

## 2021-08-08 ENCOUNTER — Encounter (HOSPITAL_BASED_OUTPATIENT_CLINIC_OR_DEPARTMENT_OTHER): Payer: Self-pay

## 2021-08-17 NOTE — Progress Notes (Signed)
Mariah [name] Phone: [phone] Subjective:   Mariah [name], am serving as a scribe for Dr. Hulan Saas.  This visit occurred during the SARS-CoV-2 public health emergency.  Safety protocols were in place, including screening questions prior to the visit, additional usage of staff PPE, and extensive cleaning of exam room while observing appropriate contact time as indicated for disinfecting solutions.   I'm seeing this patient by the request  of:  Mariah Bogus MD  CC: Hip pain  VEH:[id number]  Mariah [name] is a 60 y.o. female coming in with complaint of B hip pain. Patient states that her pain is over lateral aspect with activity for the past 4-6 weeks. L>R. Patient ices at end of day and soaks in epsom salt baths. Has used Aleve for pain. Patient plays pickleball usually for 2 hours, walks or hikes 5 days a week. Pain occurs after exercise and after she sits down and drives home. History of osteoporosis.   Patient does have a bone density showing the patient is osteoporotic.  This was done in June 2021.  Medications do show calcium and vitamin D supplementation.  Only imaging of patient's left hip was from 2010.    Past Medical History:  Diagnosis Date   Anxiety    Osteopenia    Vision abnormalities    Past Surgical History:  Procedure Laterality Date   DILATION AND CURETTAGE OF UTERUS     Social History   Socioeconomic History   Marital status: Married    Spouse name: Not on file   Number of children: Not on file   Years of education: Not on file   Highest education level: Not on file  Occupational History   Not on file  Tobacco Use   Smoking status: Former    Types: Cigarettes    Quit date: 10/02/1983    Years since quitting: 37.9   Smokeless tobacco: Never  Vaping Use   Vaping Use: Never used  Substance and Sexual Activity   Alcohol use: [name]   Drug use: [name]   Sexual activity: Yes    Partners:  Male    Comment: Vasectomy  Other Topics Concern   Not on file  Social History Narrative   Not on file   Social Determinants of Health   Financial Resource Strain: Not on file  Food Insecurity: Not on file  Transportation Needs: Not on file  Physical Activity: Not on file  Stress: Not on file  Social Connections: Not on file   [name] Known Allergies Family History  Problem Relation Age of Onset   Hypertension Mother    Skin cancer Mother    Melanoma Father    Multiple myeloma Father    Anxiety disorder Daughter    Depression Son          Current Outpatient Medications (Other):    Ascorbic Acid (VITA-C PO), Take by mouth.   CALCIUM PO, Take by mouth.   COVID-19 mRNA Vac-TriS, Pfizer, (PFIZER-BIONT COVID-19 VAC-TRIS) SUSP injection, Inject into the muscle.   estradiol (ESTRACE VAGINAL) 0.1 MG/GM vaginal cream, 1 gram pv twice weeky   LORazepam (ATIVAN) 0.5 MG tablet, Take 1/2-1 tab po TID prn anxiety and insomnia   Multiple Vitamin (ONE DAILY) tablet, Take by mouth.   Multiple Vitamins-Minerals (ZINC PO), Take by mouth.   Omega-3 Fatty Acids (FISH OIL PO), Take by mouth.   Theanine 50 MG TBDP, Take by mouth.   TURMERIC  PO, Take by mouth.   VITAMIN D PO, Take by mouth.   Reviewed prior external information including notes and imaging from  primary care provider As well as notes that were available from care everywhere and other healthcare systems.  This includes what is written previously with the osteoporosis and the bone density.  Past medical history, social, surgical and family history all reviewed in electronic medical record.  [name] pertanent information unless stated regarding to the chief complaint.   Review of Systems:  [name] headache, visual changes, nausea, vomiting, diarrhea, constipation, dizziness, abdominal pain, skin rash, fevers, chills, night sweats, weight loss, swollen lymph nodes, body aches, joint swelling, chest pain, shortness of breath, mood changes.  POSITIVE muscle aches  Objective  Blood pressure (!) 150/84, pulse 90, height _0  (1.626 m), weight 124 lb (56.2 kg), last menstrual period 07/14/2011, SpO2 98 %.   General: [name] apparent distress alert and oriented x3 mood and affect normal, dressed appropriately.  HEENT: Pupils equal, extraocular movements intact  Respiratory: Patient's speak in full sentences and does not appear short of breath  Cardiovascular: [name] lower extremity edema, non tender, [name] erythema  Gait  MSK: Hip exam shows significant discomfort of the bilateral greater trochanteric areas.  Patient does have positive FABER test noted.  Negative straight leg test but does have some tenderness to palpation in the paraspinal musculature of the lumbar spine.  [name] midline tenderness.  5 out of 5 strength of the lower extremities.  [id number]; 15 additional minutes spent for Therapeutic exercises as stated in above notes.  This included exercises focusing on stretching, strengthening, with significant focus on eccentric aspects.   Long term goals include an improvement in range of motion, strength, endurance as well as avoiding reinjury. Patient's frequency would include in 1-2 times a day, 3-5 times a week for a duration of 6-12 weeks.  Hip strengthening exercises which included:  Pelvic tilt/bracing to help with proper recruitment of the lower abs and pelvic floor muscles  Glute strengthening to properly contract glutes without over-engaging low back and hamstrings - prone hip extension and glute bridge exercises Proper stretching techniques to increase effectiveness for the hip flexors, groin, quads, piriformic and low back when appropriate  Proper technique shown and discussed handout in great detail with ATC.  All questions were discussed and answered.     Impression and Recommendations:     The above documentation has been reviewed and is accurate and complete Mariah Pulley, DO

## 2021-08-18 ENCOUNTER — Other Ambulatory Visit: Payer: Self-pay

## 2021-08-18 ENCOUNTER — Ambulatory Visit: Payer: BC Managed Care – PPO | Admitting: Family Medicine

## 2021-08-18 ENCOUNTER — Encounter: Payer: Self-pay | Admitting: Family Medicine

## 2021-08-18 ENCOUNTER — Ambulatory Visit (INDEPENDENT_AMBULATORY_CARE_PROVIDER_SITE_OTHER): Payer: BC Managed Care – PPO

## 2021-08-18 VITALS — BP 150/84 | HR 90 | Ht 64.0 in | Wt 124.0 lb

## 2021-08-18 DIAGNOSIS — M549 Dorsalgia, unspecified: Secondary | ICD-10-CM | POA: Diagnosis not present

## 2021-08-18 DIAGNOSIS — M7071 Other bursitis of hip, right hip: Secondary | ICD-10-CM | POA: Insufficient documentation

## 2021-08-18 DIAGNOSIS — M7061 Trochanteric bursitis, right hip: Secondary | ICD-10-CM | POA: Diagnosis not present

## 2021-08-18 DIAGNOSIS — M7062 Trochanteric bursitis, left hip: Secondary | ICD-10-CM

## 2021-08-18 DIAGNOSIS — M7072 Other bursitis of hip, left hip: Secondary | ICD-10-CM | POA: Insufficient documentation

## 2021-08-18 NOTE — Assessment & Plan Note (Signed)
Seems to be more secondary to a greater trochanteric bursitis.  He does have some mild atrophy noted of the gluteal tendons and musculature bilaterally.  Discussed with patient about hip abductor strengthening and work with Event organiser.  We will get x-rays to further evaluate for this.  Differential also includes lumbar radiculopathy and we will get x-rays of the lumbar spine and would consider the possibility of formal physical therapy if patient continues to have difficulty.  Patient will see me in 6 weeks and we will also consider the possibility of injection.

## 2021-08-18 NOTE — Patient Instructions (Addendum)
Do prescribed exercises at least 3x a week Tart Cherry 1200mg  daily Vit D 2000iu daily Court shoes for pickleball Icing Xrays today See you again in 6 weeks

## 2021-08-30 ENCOUNTER — Ambulatory Visit: Payer: BC Managed Care – PPO | Admitting: Psychiatry

## 2021-08-30 ENCOUNTER — Other Ambulatory Visit: Payer: Self-pay

## 2021-08-30 DIAGNOSIS — F411 Generalized anxiety disorder: Secondary | ICD-10-CM

## 2021-08-30 NOTE — Progress Notes (Signed)
      [facility] Counselor/Therapist Progress Note  Patient ID: Mariah [name], MRN: [id number],    Date: 08/30/2021  Time Spent: 50 minutes start time 10:04 AM end time 10:54 AM  Treatment Type: Individual Therapy  Reported Symptoms: sleep issues, anxiety, sadness, rumination  Mental Status Exam:  Appearance:   Well Groomed     Behavior:  Appropriate  Motor:  Normal  Speech/Language:   Normal Rate  Affect:  Appropriate  Mood:  anxious  Thought process:  normal  Thought content:    WNL  Sensory/Perceptual disturbances:    WNL  Orientation:  oriented to person, place, time/date, and situation  Attention:  Good  Concentration:  Good  Memory:  WNL  Fund of knowledge:   Good  Insight:    Good  Judgment:   Good  Impulse Control:  Good   Risk Assessment: Danger to Self:  No Self-injurious Behavior: No Danger to Others: No Duty to Warn:no Physical Aggression / Violence:No  Access to Firearms a concern: No  Gang Involvement:No   Subjective: Patient was present for session.  She shared that things are stressful due to both her mother and mother in law being sick. Her husband has decided to retire in February and he is already stressed about money.  Patient stated she is also stressed about her son and daughter and some of their choices.  Encouraged patient to focus on the things that she can control fix and change.  Discussed the importance of her working on her own self-care to deal with all the stressors in her life and the things that she is having to take care of.  Patient was also encouraged to continue communicating with her husband about her needs and desires.  Interventions: Cognitive Behavioral Therapy and Solution-Oriented/Positive Psychology  Diagnosis:   ICD-10-CM   1. Generalized anxiety disorder  F41.1       Plan: Patient is to use CBT and coping skills to decrease anxiety symptoms.  Patient is to continue focusing on the things that she can control fix and  change.  Patient is to continue exercising and working on self-care to release negative emotions appropriately.  Patient is to continue communicating with husband about her needs and the importance of them working on things together in the future. Long-term goal: Enhance ability to handle effectively the full variety of life's anxieties Short-term goal: Identify the major life complex in the past and present the form the basis for present anxiety.  Verbalize an understanding of the role that fearful thinking plays in creating fears excessive worry and persistent anxiety  [name], [facility]

## 2021-09-11 ENCOUNTER — Ambulatory Visit: Payer: BC Managed Care – PPO | Admitting: Psychiatry

## 2021-09-15 ENCOUNTER — Ambulatory Visit: Payer: BC Managed Care – PPO | Admitting: Family Medicine

## 2021-09-27 NOTE — Progress Notes (Signed)
Mariah [name] [address] [place] Phone: [phone] Subjective:   Mariah [name], am serving as a scribe for Dr. Hulan Saas. This visit occurred during the SARS-CoV-2 public health emergency.  Safety protocols were in place, including screening questions prior to the visit, additional usage of staff PPE, and extensive cleaning of exam room while observing appropriate contact time as indicated for disinfecting solutions.   I'm seeing this patient by the request  of:  Mariah [name]., MD  CC: Hip and back pain follow-up  BEE:[id number]  08/18/2021 Seems to be more secondary to a greater trochanteric bursitis.  He does have some mild atrophy noted of the gluteal tendons and musculature bilaterally.  Discussed with patient about hip abductor strengthening and work with Product/process development scientist.  We will get x-rays to further evaluate for this.  Differential also includes lumbar radiculopathy and we will get x-rays of the lumbar spine and would consider the possibility of formal physical therapy if patient continues to have difficulty.  Patient will see me in 6 weeks and we will also consider the possibility of injection.  Updte 09/28/2021 Mariah [name] is a 60 y.o. female coming in with complaint of B hip and low back pain. Patient states left hip is doing better. Less pain and less frequency. Has been following directive and it has been helping. Wants to know next steps. The exercises are helping with lower back.  Overall she states that she is approximately 80% better.  Nothing that is stopping her from activities.  Has been able to walk 2 times a week and play pickle ball 2 times a week patient is also scheduled to do hiking this weekend      Past Medical History:  Diagnosis Date   Anxiety    Osteopenia    Vision abnormalities    Past Surgical History:  Procedure Laterality Date   DILATION AND CURETTAGE OF UTERUS     Social History    Socioeconomic History   Marital status: Married    Spouse name: Not on file   Number of children: Not on file   Years of education: Not on file   Highest education level: Not on file  Occupational History   Not on file  Tobacco Use   Smoking status: Former    Types: Cigarettes    Quit date: 10/02/1983    Years since quitting: 38.0   Smokeless tobacco: Never  Vaping Use   Vaping Use: Never used  Substance and Sexual Activity   Alcohol use: No   Drug use: No   Sexual activity: Yes    Partners: Male    Comment: Vasectomy  Other Topics Concern   Not on file  Social History Narrative   Not on file   Social Determinants of Health   Financial Resource Strain: Not on file  Food Insecurity: Not on file  Transportation Needs: Not on file  Physical Activity: Not on file  Stress: Not on file  Social Connections: Not on file   No Known Allergies Family History  Problem Relation Age of Onset   Hypertension Mother    Skin cancer Mother    Melanoma Father    Multiple myeloma Father    Anxiety disorder Daughter    Depression Son          Current Outpatient Medications (Other):    Ascorbic Acid (VITA-C PO), Take by mouth.   CALCIUM PO, Take by mouth.   COVID-19 mRNA  Vac-TriS, Pfizer, (PFIZER-BIONT COVID-19 VAC-TRIS) SUSP injection, Inject into the muscle.   estradiol (ESTRACE VAGINAL) 0.1 MG/GM vaginal cream, 1 gram pv twice weeky   LORazepam (ATIVAN) 0.5 MG tablet, Take 1/2-1 tab po TID prn anxiety and insomnia   Multiple Vitamin (ONE DAILY) tablet, Take by mouth.   Multiple Vitamins-Minerals (ZINC PO), Take by mouth.   Omega-3 Fatty Acids (FISH OIL PO), Take by mouth.   Theanine 50 MG TBDP, Take by mouth.   TURMERIC PO, Take by mouth.   VITAMIN D PO, Take by mouth.    Objective  Blood pressure 116/86, pulse 78, height '5\' 4"'  (1.626 m), weight 124 lb (56.2 kg), last menstrual period 07/14/2011, SpO2 97 %.   General: No apparent distress alert and oriented x3 mood  and affect normal, dressed appropriately.  HEENT: Pupils equal, extraocular movements intact  Respiratory: Patient's speak in full sentences and does not appear short of breath  Cardiovascular: No lower extremity edema, non tender, no erythema  Gait normal with good balance and coordination.  MSK: Patient is sitting comfortably in the chair.  Seems much more comfortable.  Change positions only 1 time during the exam today.  Appears to be nontender on exam.    Impression and Recommendations:     The above documentation has been reviewed and is accurate and complete Lyndal Pulley, DO

## 2021-09-28 ENCOUNTER — Ambulatory Visit (INDEPENDENT_AMBULATORY_CARE_PROVIDER_SITE_OTHER): Payer: BC Managed Care – PPO | Admitting: Family Medicine

## 2021-09-28 ENCOUNTER — Other Ambulatory Visit: Payer: Self-pay

## 2021-09-28 DIAGNOSIS — M5432 Sciatica, left side: Secondary | ICD-10-CM

## 2021-09-28 NOTE — Assessment & Plan Note (Signed)
Patient seems to be doing well at this time.  Patient wants to hold on any type of injections on the hips or the back at the moment.  I believe patient will do very well with conservative therapy.  Patient would like to follow-up with [place] on an as-needed basis.

## 2021-11-15 ENCOUNTER — Ambulatory Visit: Payer: BC Managed Care – PPO | Admitting: Psychiatry

## 2021-11-15 ENCOUNTER — Other Ambulatory Visit: Payer: Self-pay

## 2021-11-15 ENCOUNTER — Encounter: Payer: Self-pay | Admitting: Psychiatry

## 2021-11-15 DIAGNOSIS — F411 Generalized anxiety disorder: Secondary | ICD-10-CM | POA: Diagnosis not present

## 2021-11-15 DIAGNOSIS — F5101 Primary insomnia: Secondary | ICD-10-CM

## 2021-11-15 MED ORDER — PAROXETINE HCL 10 MG PO TABS: 5.0000 mg | ORAL_TABLET | Freq: Every day | ORAL | 1 refills | Status: DC

## 2021-11-15 MED ORDER — LORAZEPAM 0.5 MG PO TABS: ORAL_TABLET | ORAL | 0 refills | Status: DC

## 2021-11-15 NOTE — Progress Notes (Signed)
Mariah [name] [id number] 23-Oct-1960 61 y.o.  Subjective:   Patient ID:  Mariah [name] is a 61 y.o. (DOB 04-Apr-1961) female.  Chief Complaint:  Chief Complaint  Patient presents with   Anxiety   Insomnia    HPI Mariah [name] presents emergently to the office today for anxiety and insomnia. She was last seen on 63/5/21. She reports that she decided not to start Paxil after last visit. She reports, "I've got so much anxiety." She questions if menopause contributes to her anxiety. Her mother became ill over Thanksgiving and was hospitalized. Mariah [name] then became the caregiver for her mother and also sister with special needs that mother takes care of. Had respiratory illness in January for 2 weeks. She reports that she is trying to get strength back from illness. She reports that she has anxiety about taking medications. Went to [facility] about 2 weeks ago for 10 days. She reports that she started having severe anxiety on trip and thought she was having heart issues. She reports that she has been having heart racing. She reports muscle tension. Denies shortness of breath. Denies GI s/s. Has anxiety about getting older. Catastrophic thinking and describes rumination.   She reports that she can sleep 4-6 hours on a "good night." Occ can get an additional 1-2 hours of sleep. Difficulty returning to sleep after awakening to use the bathroom. She reports that she may have some sadness "but it's different" from depression she has had in the past. She reports that her appetite has been good. Energy and motivation have been good. She reports that her concentration depends on how she slept. Denies SI.   She reports that being in the sun helps her mood and anxiety. Eating healthy and exercise help with anxiety.   Husband is retiring today. Husband is caregiver for his 101 yo mother. She reports that this has taken a toll on him and their marriage. She reports that her mother is also exhibiting possible s/s of dementia.   Has  company coming.   She retired in 2020. Daughter lives in [place]. She has been playing pickle ball. She enjoys crafting. She hikes, bikes, paddles, and does volunteer work.   Mother has taken Paxil 40 mg long-term.   Having cataract surgery in 2 weeks.   Past Psychiatric Medication Trials: Difficulty with starting SSRI's in the past (fatigue, difficulty with concentration, unable to function) Silenor Belsomra-side effects, minimal efficacy Ambien- Parasomnias Benadryl- excessive somnolence Melatonin- ineffective Lexapro- adverse effects Zoloft-adverse effects Ativan- Effective, has grogginess that carried over the next day. Has taken 0.5 mg.       Review of Systems:  Review of Systems  Cardiovascular:        Increased HR with anxiety  Musculoskeletal:  Negative for gait problem.  Psychiatric/Behavioral:         Please refer to HPI   Medications: I have reviewed the patient's current medications.  Current Outpatient Medications  Medication Sig Dispense Refill   Ascorbic Acid (VITA-C PO) Take by mouth.     CALCIUM PO Take by mouth.     MELATONIN ER PO Take by mouth.     Multiple Vitamin (ONE DAILY) tablet Take by mouth.     Multiple Vitamins-Minerals (ZINC PO) Take by mouth.     Omega-3 Fatty Acids (FISH OIL PO) Take by mouth.     TURMERIC PO Take by mouth.     VITAMIN D PO Take by mouth.     COVID-19 mRNA Vac-TriS, Pfizer, (PFIZER-BIONT COVID-19 VAC-TRIS)  SUSP injection Inject into the muscle. 0.3 mL 0   LORazepam (ATIVAN) 0.5 MG tablet Take 1/2-1 tab po TID prn anxiety and insomnia 90 tablet 0   PARoxetine (PAXIL) 10 MG tablet Take 0.5 tablets (5 mg total) by mouth daily. 15 tablet 1   No current facility-administered medications for this visit.    Medication Side Effects: Other: N/A  Allergies: No Known Allergies  Past Medical History:  Diagnosis Date   Anxiety    Osteopenia    Vision abnormalities     Past Medical History, Surgical history, Social  history, and Family history were reviewed and updated as appropriate.   Please see review of systems for further details on the patient's review from today.   Objective:   Physical Exam:  LMP 07/14/2011   Physical Exam Constitutional:      General: She is not in acute distress. Musculoskeletal:        General: No deformity.  Neurological:     Mental Status: She is alert and oriented to person, place, and time.     Coordination: Coordination normal.  Psychiatric:        Attention and Perception: Attention and perception normal. She does not perceive auditory or visual hallucinations.        Mood and Affect: Mood is anxious. Mood is not depressed. Affect is not labile, blunt, angry or inappropriate.        Speech: Speech normal.        Behavior: Behavior is cooperative.        Thought Content: Thought content normal. Thought content is not paranoid or delusional. Thought content does not include homicidal or suicidal ideation. Thought content does not include homicidal or suicidal plan.        Cognition and Memory: Cognition and memory normal.        Judgment: Judgment normal.     Comments: Insight intact Restless. Presents as tense    Lab Review:     Component Value Date/Time   NA 141 06/28/2014 1618   K 4.0 06/28/2014 1618   CL 103 06/28/2014 1618   CO2 28 06/28/2014 1607   GLUCOSE 105 (H) 06/28/2014 1618   BUN 16 06/28/2014 1618   CREATININE 0.80 06/28/2014 1618   CALCIUM 9.5 06/28/2014 1607   GFRNONAA >90 06/28/2014 1607   GFRAA >90 06/28/2014 1607       Component Value Date/Time   WBC 8.1 06/28/2014 1607   RBC 4.27 06/28/2014 1607   HGB 13.9 06/28/2014 1618   HCT 41.0 06/28/2014 1618   PLT 227 06/28/2014 1607   MCV 87.8 06/28/2014 1607   MCH 30.2 06/28/2014 1607   MCHC 34.4 06/28/2014 1607   RDW 12.9 06/28/2014 1607   LYMPHSABS 1.7 06/28/2014 1607   MONOABS 0.6 06/28/2014 1607   EOSABS 0.1 06/28/2014 1607   BASOSABS 0.0 06/28/2014 1607    No results  found for: POCLITH, LITHIUM   No results found for: PHENYTOIN, PHENOBARB, VALPROATE, CBMZ   .res Assessment: Plan:    Pt seen for 45 minutes and time spent reviewing medical and social history since last visits 2 years prior, discussing current s/s, and possible treatment options. Discussed her responses to past medication trials and concerns about starting medication. Discussed potential benefits, risks, and side effects of Paxil and Prozac. Discussed that paxil is typically effective for panic s/s and she may be more likely to have a favorable response to paxil since her mother has responded well to paxil without tolerability issues.  Discussed that 10 mg is typical starting dose for Paxil and that dose could be initiated at 5 mg daily to minimize risk of side effects. Pt agrees to trial of Paxil 5 mg po qd.  Will also re-start Lorazepam 0.5 mg 1/2-1 tab po TID prn anxiety and insomnia. Discussed that Lorazepam may be helpful for recent insomnia and to stabilize acute anxiety while waiting for Paxil to become effective.  Recommend continuing therapy with [name], [facility].  Pt to follow-up with this provider in 4-6 weeks or sooner if clinically indicated.  Patient advised to contact office with any questions, adverse effects, or acute worsening in signs and symptoms.   Mariah [name] was seen today for anxiety and insomnia.  Diagnoses and all orders for this visit:  Generalized anxiety disorder -     PARoxetine (PAXIL) 10 MG tablet; Take 0.5 tablets (5 mg total) by mouth daily. -     LORazepam (ATIVAN) 0.5 MG tablet; Take 1/2-1 tab po TID prn anxiety and insomnia  Primary insomnia -     LORazepam (ATIVAN) 0.5 MG tablet; Take 1/2-1 tab po TID prn anxiety and insomnia     Please see After Visit Summary for patient specific instructions.  Future Appointments  Date Time Provider Department Center  11/29/2021 12:00 PM [name], [facility] None  12/20/2021 10:30 AM Corie Chiquito, PMHNP [facility]CP  None  01/10/2022 12:00 PM [name], [facility] None  02/19/2022 10:15 AM Jerene Bears, MD [facility]    No orders of the defined types were placed in this encounter.   -------------------------------

## 2021-11-23 ENCOUNTER — Ambulatory Visit: Payer: BC Managed Care – PPO | Admitting: Psychiatry

## 2021-11-29 ENCOUNTER — Ambulatory Visit: Payer: BC Managed Care – PPO | Admitting: Psychiatry

## 2021-12-04 ENCOUNTER — Other Ambulatory Visit: Payer: Self-pay

## 2021-12-04 ENCOUNTER — Ambulatory Visit: Payer: BC Managed Care – PPO | Admitting: Psychiatry

## 2021-12-04 DIAGNOSIS — F411 Generalized anxiety disorder: Secondary | ICD-10-CM | POA: Diagnosis not present

## 2021-12-04 NOTE — Progress Notes (Signed)
?    [facility] Counselor/Therapist Progress Note ? ?Patient ID: Corianna Hiestand, MRN: [id number],   ? ?Date: 12/04/2021 ? ?Time Spent: 51 minutes start time 8:05 AM end time 8:56 AM ? ?Treatment Type: Individual Therapy ? ?Reported Symptoms: anxiety,restless, agitated, sleep issues, fatigue, panic ? ?Mental Status Exam: ? ?Appearance:   Casual and Neat     ?Behavior:  Appropriate  ?Motor:  Restlestness  ?Speech/Language:   Normal Rate  ?Affect:  Appropriate  ?Mood:  anxious  ?Thought process:  normal  ?Thought content:    WNL  ?Sensory/Perceptual disturbances:    WNL  ?Orientation:  oriented to person, place, time/date, and situation  ?Attention:  Good  ?Concentration:  Good  ?Memory:  WNL  ?Fund of knowledge:   Good  ?Insight:    Good  ?Judgment:   Good  ?Impulse Control:  Good  ? ?Risk Assessment: ?Danger to Self:  No ?Self-injurious Behavior: No ?Danger to Others: No ?Duty to Warn:no ?Physical Aggression / Violence:No  ?Access to Firearms a concern: No  ?Gang Involvement:No  ? ?Subjective: Patient was present for session. She shared she has been struggling with anxiety.  She reported that she has started medication but is still having issues.  She shared that her husband retired and was diagnosed with sleep apnea.  She shared that she recently had cataract surgery and he has been helped by her deal with that.  She explained that still do not have a lot of time together because of having to deal with her elderly parents.  Patient explained she was able to get away and stay with her cousin in [place] and even though the flight and beginning of the week increased her panic what she was able to settle him she noticed a decrease in her anxiety.  Patient shared her concerns about her aging mother and mentally ill sister needing more from her in the near future.  She explained what her mother was sick and not driving she was going to see them every day and that became very draining on her and led to some sicknesses.   Patient was encouraged to realize she is going to have to figure out some ways to take care of herself if she does go to be able to take care of others.  The importance of limit setting and recognizing what is hers and what is others was discussed with patient.  Encouraged patient to focus on her own self-care at this time and to recognize what she needs to do to keep herself in a positive direction.  Encouraged patient to continue trying the medication as well as other relaxation methods.  Patient was also encouraged to try and think about her situation as something she can think through and have positive outcomes with. ? ?Interventions: Cognitive Behavioral Therapy and Solution-Oriented/Positive Psychology ? ?Diagnosis: ?  ICD-10-CM   ?1. Generalized anxiety disorder  F41.1   ?  ? ? ?Plan: Patient is to use CBT and coping skills to decrease anxiety symptoms.  Patient is to continue focusing on the things that she can control fix and change.  Patient is to continue exercising and working on self-care to release negative emotions appropriately.  Patient is to take medication as directed. ?Long-term goal: Enhance ability to handle effectively the full variety of life's anxieties ?Short-term goal: Identify the major life complex in the past and present the form the basis for present anxiety.  Verbalize an understanding of the role that fearful thinking plays in creating fears  excessive worry and persistent anxiety ? ?Lina Sayre, [facility] ? ? ? ? ? ? ? ? ? ? ? ? ? ? ? ? ? ? ?

## 2021-12-08 ENCOUNTER — Other Ambulatory Visit: Payer: Self-pay | Admitting: Psychiatry

## 2021-12-08 DIAGNOSIS — F411 Generalized anxiety disorder: Secondary | ICD-10-CM

## 2021-12-12 ENCOUNTER — Telehealth: Payer: Self-pay | Admitting: Psychiatry

## 2021-12-12 NOTE — Telephone Encounter (Signed)
Received the following message from pt: "I?m 18 days in on Paxil. I do not think I can continue this medication. My appointment is next Wednesday but I do not think I can wait until then to stop this medication. I?m worried about these bad side effects getting even worse coming off.  ?I am so mean/ agitated on this medication as well as being zoned out and feeling so out of control. I am now again negative about these medications." ? ?Called pt. She reports that she has been having difficulty with concentration and feeling "foggy." She reports that she feels "zoned out." She reports that she has had increased irritability and today it has not been as bad. She reports that her appetite has been decreased mid-day and she has to make sure she eats a good breakfast. She reports that she feels "foggy" for 7 hours after taking Paxil. She reports that she has been taking Paxil in the morning. She reports feeling out of control. She reports that before starting medication she would have the most anxiety at night. She reports that she has not been needing to take the Lorazepam prn as often.  ? ?"I'd rather go back to how I was before on medication." ? ?She has had eye surgeries and has not been able to be as active as usual.  ? ?Last took Paxil at 9 am.  ? ?Will take 1/4 Paxil 10 mg tonight and will contact office tomorrow to discuss response to determine ongoing plan.  ?

## 2021-12-13 NOTE — Telephone Encounter (Signed)
Pt sent message today: "10 pm last night I took 1/4 tab of Paxil. I probably fell sleep within the hour, however I feel like it was light sleep. At 2:45 am I was awaken with high anxiety, skin sensations, hot, dry mouth, headache and brain zaps. I did get myself calmed down eventually and probably got back to sleep around 4am. 6:45 am I am awaken again with high anxiety.  ?  ?Also, remember many nights I?m up around 3 am with insomnia, sometimes with anxiety and some without. That was my problem pre medication."  ? ?Called pt to follow-up after decrease in Paxil to 2.5 mg. She reports that she had a difficult night and felt "hyped-up" today. She reports having excess energy and has been cleaning and going to go on a walk. She has had high anxiety. She reports that she had sleep difficulties last night, which is typical for her. "I feel like I have gone from bad to worse." She reports increased irritability and that this may be due to decreased sleep. She denies impulsivity or risky behavior. Denies panic attacks- "just high anxiety."  ? ?She reports that she does not want to take Lorazepam during the day since this will cause her to sleep and then disrupt sleep at night. She reports that Lorazepam has helped her sleep on a consistent basis.  ? ?She reports that Sertraline and Lexapro caused her to be unable to function and work.  ? ?Ok to stop Paxil. Discussed using Lorazepam prn to help with anxiety and discomfort with discontinuation s/s.  ? ?

## 2021-12-20 ENCOUNTER — Ambulatory Visit: Payer: BC Managed Care – PPO | Admitting: Psychiatry

## 2021-12-29 ENCOUNTER — Encounter: Payer: Self-pay | Admitting: Psychiatry

## 2021-12-29 ENCOUNTER — Ambulatory Visit: Payer: BC Managed Care – PPO | Admitting: Psychiatry

## 2021-12-29 VITALS — HR 77

## 2021-12-29 DIAGNOSIS — F411 Generalized anxiety disorder: Secondary | ICD-10-CM

## 2021-12-29 DIAGNOSIS — F5101 Primary insomnia: Secondary | ICD-10-CM

## 2021-12-29 MED ORDER — GABAPENTIN 100 MG PO CAPS: ORAL_CAPSULE | ORAL | 1 refills | Status: DC

## 2021-12-29 NOTE — Progress Notes (Signed)
Mariah [name] ?[id number] ?Feb 27, 1961 ?61 y.o. ? ?Subjective:  ? ?Patient ID:  Mariah [name] is a 61 y.o. (DOB 09-Aug-1961) female. ? ?Chief Complaint:  ?Chief Complaint  ?Patient presents with  ? Anxiety  ? Insomnia  ? ? ?HPI ?Kana Reimann presents to the office today for follow-up of anxiety and insomnia. She reports that she felt relief coming off Paxil with resolution of side effects. She reports that she continues to have severe anxiety. She has been "wearing myself out physically" to be able to sleep. Motivation has been good. She reports that she can not sleep longer than 4 hours without awakening. She reports that she is taking Lorazepam about once a week and is not sleeping as long as she was previously. She reports anxiety is worse at night.  ? ?She reports periods of increased HR and is concerned about family h/o cardiac issues (both grandmothers died at age 59 from heart disease). She reports feeling that her heart is racing and that her fit bit does not indicate an increase in HR. She has been waking up with hot flashes. She reports that she now has a "phobia of going out of town." She had anxiety with getting on elevator alone. She reports feeling tired. Denies depressed mood. Concentration has not been good when she has sleep disturbance. Appetite has been better. Denies SI.  ? ?Past Psychiatric Medication Trials: ?Difficulty with starting SSRI's in the past (fatigue, difficulty with concentration, unable to function) ?Silenor ?Belsomra-side effects, minimal efficacy ?Ambien- Parasomnias ?Benadryl- excessive somnolence ?Melatonin- ineffective ?Lexapro- adverse effects ?Paxil- Multiple side effects ?Zoloft-adverse effects ?Ativan- Effective, has grogginess that carried over the next day. Has taken 0.5 mg.  ?  ? ?Review of Systems:  ?Review of Systems  ?Musculoskeletal:  Negative for gait problem.  ?Neurological:  Negative for tremors.  ?Psychiatric/Behavioral:    ?     Please refer to HPI  ? ?Medications: I have  reviewed the patient's current medications. ? ?Current Outpatient Medications  ?Medication Sig Dispense Refill  ? gabapentin (NEURONTIN) 100 MG capsule Take 1-3 capsules at bedtime and 1 capsule twice daily as needed 120 capsule 1  ? LORazepam (ATIVAN) 0.5 MG tablet Take 1/2-1 tab po TID prn anxiety and insomnia 90 tablet 0  ? MELATONIN ER PO Take by mouth.    ? Ascorbic Acid (VITA-C PO) Take by mouth.    ? CALCIUM PO Take by mouth.    ? COVID-19 mRNA Vac-TriS, Pfizer, (PFIZER-BIONT COVID-19 VAC-TRIS) SUSP injection Inject into the muscle. 0.3 mL 0  ? Multiple Vitamin (ONE DAILY) tablet Take by mouth.    ? Multiple Vitamins-Minerals (ZINC PO) Take by mouth.    ? Omega-3 Fatty Acids (FISH OIL PO) Take by mouth.    ? TURMERIC PO Take by mouth.    ? VITAMIN D PO Take by mouth.    ? ?No current facility-administered medications for this visit.  ? ? ?Medication Side Effects: Other: Multiple side effects with Paxil  ? ?Allergies: No Known Allergies ? ?Past Medical History:  ?Diagnosis Date  ? Anxiety   ? Osteopenia   ? Vision abnormalities   ? ? ?Past Medical History, Surgical history, Social history, and Family history were reviewed and updated as appropriate.  ? ?Please see review of systems for further details on the patient's review from today.  ? ?Objective:  ? ?Physical Exam:  ?Pulse 77   LMP 07/14/2011  ? ?Physical Exam ?Constitutional:   ?   General: She is not in  acute distress. ?Musculoskeletal:     ?   General: No deformity.  ?Neurological:  ?   Mental Status: She is alert and oriented to person, place, and time.  ?   Coordination: Coordination normal.  ?Psychiatric:     ?   Attention and Perception: Attention and perception normal. She does not perceive auditory or visual hallucinations.     ?   Mood and Affect: Mood is anxious. Mood is not depressed. Affect is not labile, blunt, angry or inappropriate.     ?   Speech: Speech normal.     ?   Behavior: Behavior normal.     ?   Thought Content: Thought content  normal. Thought content is not paranoid or delusional. Thought content does not include homicidal or suicidal ideation. Thought content does not include homicidal or suicidal plan.     ?   Cognition and Memory: Cognition and memory normal.     ?   Judgment: Judgment normal.  ?   Comments: Insight intact  ? ? ?Lab Review:  ?   ?Component Value Date/Time  ? NA 141 06/28/2014 1618  ? K 4.0 06/28/2014 1618  ? CL 103 06/28/2014 1618  ? CO2 28 06/28/2014 1607  ? GLUCOSE 105 (H) 06/28/2014 1618  ? BUN 16 06/28/2014 1618  ? CREATININE 0.80 06/28/2014 1618  ? CALCIUM 9.5 06/28/2014 1607  ? GFRNONAA >90 06/28/2014 1607  ? GFRAA >90 06/28/2014 1607  ? ? ?   ?Component Value Date/Time  ? WBC 8.1 06/28/2014 1607  ? RBC 4.27 06/28/2014 1607  ? HGB 13.9 06/28/2014 1618  ? HCT 41.0 06/28/2014 1618  ? PLT 227 06/28/2014 1607  ? MCV 87.8 06/28/2014 1607  ? MCH 30.2 06/28/2014 1607  ? MCHC 34.4 06/28/2014 1607  ? RDW 12.9 06/28/2014 1607  ? LYMPHSABS 1.7 06/28/2014 1607  ? MONOABS 0.6 06/28/2014 1607  ? EOSABS 0.1 06/28/2014 1607  ? BASOSABS 0.0 06/28/2014 1607  ? ? ?No results found for: POCLITH, LITHIUM  ? ?No results found for: PHENYTOIN, PHENOBARB, VALPROATE, CBMZ  ? ?.res ?Assessment: Plan:   ? ?Pt seen for 30 minutes and time spent discussing treatment options for anxiety and alternatives to SSRI's since she has had adverse effects with SSRI's. Discussed potential benefits, risks, and side effects of Gabapentin. Discussed that Gabapentin is used off label for anxiety. Pt agrees to trial of Gabapentin. Discussed starting Gabapentin at 100 mg at bedtime initially and then gradually increasing dose based on response up to 300 mg to improve insomnia. Discussed that she could also start Gabapentin 100 mg twice daily as needed to help improve anxiety.  ?Continue Lorazepam 0.5 mg 1/2-1 tab po TID prn anxiety.  ?Recommend continuing therapy with [name], [facility]  ?Pt to follow-up in 4-6 weeks or sooner if clinically indicated.   ?Patient advised to contact office with any questions, adverse effects, or acute worsening in signs and symptoms. ? ? ?Kristyana was seen today for anxiety and insomnia. ? ?Diagnoses and all orders for this visit: ? ?Generalized anxiety disorder ?-     gabapentin (NEURONTIN) 100 MG capsule; Take 1-3 capsules at bedtime and 1 capsule twice daily as needed ? ?Primary insomnia ? ?  ? ?Please see After Visit Summary for patient specific instructions. ? ?Future Appointments  ?Date Time Provider Department Center  ?01/10/2022 12:00 PM [name], [facility] None  ?02/05/2022 10:30 AM Corie Chiquito, [facility] None  ?02/19/2022 10:15 AM Jerene Bears, MD [facility]  ? ? ?  No orders of the defined types were placed in this encounter. ? ? ?------------------------------- ?

## 2021-12-29 NOTE — Telephone Encounter (Signed)
Mariah [name] called and LM today a12:09 to report that there was a problem with her insurance. Pharmacy said it was the way it was written.  She asked to be called back so she could explain. ?

## 2022-01-10 ENCOUNTER — Ambulatory Visit: Payer: BC Managed Care – PPO | Admitting: Psychiatry

## 2022-01-10 DIAGNOSIS — F411 Generalized anxiety disorder: Secondary | ICD-10-CM

## 2022-01-10 NOTE — Progress Notes (Signed)
?    [facility] Counselor/Therapist Progress Note ? ?Patient ID: Mariah [name], MRN: [id number],   ? ?Date: 01/10/2022 ? ?Time Spent: 54 minutes start time 12:05 PM end time 12:59 PM ? ?Treatment Type: Individual Therapy ? ?Reported Symptoms: anxiety, sleeping issues, panic, sadness, fatigue ? ?Mental Status Exam: ? ?Appearance:   Casual and Neat     ?Behavior:  Appropriate  ?Motor:  Normal  ?Speech/Language:   Normal Rate  ?Affect:  Appropriate  ?Mood:  anxious  ?Thought process:  normal  ?Thought content:    WNL  ?Sensory/Perceptual disturbances:    WNL  ?Orientation:  oriented to person, place, time/date, and situation  ?Attention:  Good  ?Concentration:  Good  ?Memory:  WNL  ?Fund of knowledge:   Good  ?Insight:    Good  ?Judgment:   Good  ?Impulse Control:  Good  ? ?Risk Assessment: ?Danger to Self:  No ?Self-injurious Behavior: No ?Danger to Others: No ?Duty to Warn:no ?Physical Aggression / Violence:No  ?Access to Firearms a concern: No  ?Gang Involvement:No  ? ?Subjective: Patient was present for session. She shared that things have been crazy still with the mother's.  She shared that sleep issues are the making things very difficult for her.  She was able to realize that she may need to continue taking supplements and her Neurontin or lorazepam as needed.  Patient was encouraged to recognize that that is an okay thing and that getting a decent night's sleep every few nights is important and will help decrease her anxiety and depression so she needs to do to make sure she gets some rest.  Patient went on to share that things are better with her children she shared that each of them seem to be stable at this time.  She shared it is still hard for her because she remembers the times when her mental health was not in a good place.  Encouraged her to try and breathe and stay focused on what she is noticing right now and to remind herself that it be okay.  Ways for her to focus on her needs as she is doing that  were discussed in session.  Was also reminded of her box and to focus on the things that she can control fix and change.  Patient shared she is continuing to exercise to release negative emotions appropriately.  She also shared that her husband is considering going back to work part-time because he is struggling with retirement.  Patient was encouraged to let him do what he needs to do so that he can be healthier in the relationship because that seems to decrease her anxiety. ? ?Interventions: Cognitive Behavioral Therapy and Solution-Oriented/Positive Psychology ? ?Diagnosis: ?  ICD-10-CM   ?1. Generalized anxiety disorder  F41.1   ?  ? ? ?Plan: Patient is to use CBT and coping skills to decrease anxiety symptoms.  Patient is to continue focusing on the things that she can control fix and change.  Patient is to continue exercising and working on self-care to release negative emotions appropriately.  Patient is to take her supplements and medication appropriately to make sure that she is able to sleep at least a couple nights a week.  Patient is to take medication as directed. ?Long-term goal: Enhance ability to handle effectively the full variety of life's anxieties ?Short-term goal: Identify the major life complex in the past and present the form the basis for present anxiety.  Verbalize an understanding of the role that fearful  thinking plays in creating fears excessive worry and persistent anxiety ? ?[name], [facility] ? ? ? ? ? ? ? ? ? ? ? ? ? ? ? ? ? ? ?

## 2022-02-05 ENCOUNTER — Ambulatory Visit: Payer: BC Managed Care – PPO | Admitting: Psychiatry

## 2022-02-05 ENCOUNTER — Encounter: Payer: Self-pay | Admitting: Psychiatry

## 2022-02-05 VITALS — BP 154/88 | HR 79

## 2022-02-05 DIAGNOSIS — F5101 Primary insomnia: Secondary | ICD-10-CM | POA: Diagnosis not present

## 2022-02-05 DIAGNOSIS — F411 Generalized anxiety disorder: Secondary | ICD-10-CM | POA: Diagnosis not present

## 2022-02-05 MED ORDER — CLONIDINE HCL 0.1 MG PO TABS: ORAL_TABLET | ORAL | 2 refills | Status: DC

## 2022-02-05 NOTE — Progress Notes (Signed)
Mariah DoeJanet [name] ?[id number] ?12/11/1960 ?61 y.o. ? ?Subjective:  ? ?Patient ID:  Mariah DoeJanet Lawn is a 61 y.o. (DOB 04/18/1961) female. ? ?Chief Complaint:  ?Chief Complaint  ?Patient presents with  ? Anxiety  ? ? ?Anxiety ? ? ? ?Mariah DoeJanet Dethloff presents to the office today for follow-up of anxiety and insomnia. She was not able to tolerate Gabapentin due to excessive somnolence. She reports, "I hurt everyday" and was not hurting when she took Gabapentin. ? ?She reports that her anxiety has been "not good." She reports that she has had ongoing issues with anxiety since January 2023. She has not been able to tolerate multiple medications.  ? ?She reports that she has been having insomnia and is interested in possibly starting medication for insomnia. She took Lorazepam prn while traveling for girls trip and was able to sleep. She reports that she did not experience anxiety while on trip with friends. She has anxiety with sleeping at home when she is alone. She took Lorazepam prn at bedtime when husband was recently away and this is a new trigger. She reports that she has had a few episodes of feeling very tired in the middle of the day, even when she has had more sleep. She has been using an herbal tea "Nighty Night Extra" and this is slightly helpful.  ? ?Energy varies. Motivation has been good. Denies depressed mood. She reports that her concentration varies depending on level of anxiety. Appetite has been ok. Denies SI.  ? ?She walks, hikes, and plays pickleball. ? ?She reports that she does not want to take Lorazepam routinely due to risks.  ?  ?Past Psychiatric Medication Trials: ?Difficulty with starting SSRI's in the past (fatigue, difficulty with concentration, unable to function) ?Silenor ?Belsomra-side effects, minimal efficacy ?Ambien- Parasomnias ?Benadryl- excessive somnolence ?Melatonin- ineffective ?Lexapro- adverse effects ?Paxil- Multiple side effects ?Zoloft-adverse effects ?Ativan- Effective, has grogginess that  carried over the next day. Has taken 0.5 mg.  ?Gabapentin- Excessive somnolence ?  ? ?Review of Systems:  ?Review of Systems  ?Cardiovascular:   ?     She reports that her heart will feel like it is racing, however HR is normal  ?Musculoskeletal:  Negative for gait problem.  ?Neurological:  Positive for headaches.  ?Psychiatric/Behavioral:    ?     Please refer to HPI  ? ?Medications: I have reviewed the patient's current medications. ? ?Current Outpatient Medications  ?Medication Sig Dispense Refill  ? cloNIDine (CATAPRES) 0.1 MG tablet Take 1/2 tablet at bedtime for 4-7 days, then can increase to 1 tablet at bedtime as tolerated 30 tablet 2  ? LORazepam (ATIVAN) 0.5 MG tablet Take 1/2-1 tab po TID prn anxiety and insomnia 90 tablet 0  ? Ascorbic Acid (VITA-C PO) Take by mouth.    ? CALCIUM PO Take by mouth.    ? COVID-19 mRNA Vac-TriS, Pfizer, (PFIZER-BIONT COVID-19 VAC-TRIS) SUSP injection Inject into the muscle. 0.3 mL 0  ? MELATONIN ER PO Take by mouth.    ? Multiple Vitamin (ONE DAILY) tablet Take by mouth.    ? Multiple Vitamins-Minerals (ZINC PO) Take by mouth.    ? Omega-3 Fatty Acids (FISH OIL PO) Take by mouth.    ? TURMERIC PO Take by mouth.    ? VITAMIN D PO Take by mouth.    ? ?No current facility-administered medications for this visit.  ? ? ?Medication Side Effects: Sedation ? ?Allergies: No Known Allergies ? ?Past Medical History:  ?Diagnosis Date  ? Anxiety   ?  Osteopenia   ? Vision abnormalities   ? ? ?Past Medical History, Surgical history, Social history, and Family history were reviewed and updated as appropriate.  ? ?Please see review of systems for further details on the patient's review from today.  ? ?Objective:  ? ?Physical Exam:  ?BP (!) 154/88   Pulse 79   LMP 07/14/2011  ? ?Physical Exam ?Constitutional:   ?   General: She is not in acute distress. ?Musculoskeletal:     ?   General: No deformity.  ?Neurological:  ?   Mental Status: She is alert and oriented to person, place, and time.   ?   Coordination: Coordination normal.  ?Psychiatric:     ?   Attention and Perception: Attention and perception normal. She does not perceive auditory or visual hallucinations.     ?   Mood and Affect: Mood is anxious. Mood is not depressed. Affect is not labile, blunt, angry or inappropriate.     ?   Speech: Speech normal.     ?   Behavior: Behavior normal.     ?   Thought Content: Thought content normal. Thought content is not paranoid or delusional. Thought content does not include homicidal or suicidal ideation. Thought content does not include homicidal or suicidal plan.     ?   Cognition and Memory: Cognition and memory normal.     ?   Judgment: Judgment normal.  ?   Comments: Insight intact  ? ? ?Lab Review:  ?   ?Component Value Date/Time  ? NA 141 06/28/2014 1618  ? K 4.0 06/28/2014 1618  ? CL 103 06/28/2014 1618  ? CO2 28 06/28/2014 1607  ? GLUCOSE 105 (H) 06/28/2014 1618  ? BUN 16 06/28/2014 1618  ? CREATININE 0.80 06/28/2014 1618  ? CALCIUM 9.5 06/28/2014 1607  ? GFRNONAA >90 06/28/2014 1607  ? GFRAA >90 06/28/2014 1607  ? ? ?   ?Component Value Date/Time  ? WBC 8.1 06/28/2014 1607  ? RBC 4.27 06/28/2014 1607  ? HGB 13.9 06/28/2014 1618  ? HCT 41.0 06/28/2014 1618  ? PLT 227 06/28/2014 1607  ? MCV 87.8 06/28/2014 1607  ? MCH 30.2 06/28/2014 1607  ? MCHC 34.4 06/28/2014 1607  ? RDW 12.9 06/28/2014 1607  ? LYMPHSABS 1.7 06/28/2014 1607  ? MONOABS 0.6 06/28/2014 1607  ? EOSABS 0.1 06/28/2014 1607  ? BASOSABS 0.0 06/28/2014 1607  ? ? ?No results found for: POCLITH, LITHIUM  ? ?No results found for: PHENYTOIN, PHENOBARB, VALPROATE, CBMZ  ? ?.res ?Assessment: Plan:   ?Pt seen for 30 minutes and time spent discussing treatment options for anxiety and reviewing the pharmacogenetic testing results of her child to extrapolate possible genetic contraindications. Discussed potential benefits, risks, and side effects of Clonidine. Discussed that Clonidine may be helpful for anxiety and insomnia. Pt agrees to trial  of Clonidine. Will start Clonidine 0.1 mg 1/2 tablet at bedtime for 4-7 days, then increase to 1 tablet at bedtime for anxiety and insomnia.  ?Consider Trileptal if unable to tolerate Clonidine.  ?Continue Lorazepam 0.5 mg 1/2-1 tab po TID prn anxiety and insomnia.  ?Recommend continuing therapy with [name], [facility].  ?Pt to follow-up with this provider in 4 weeks or sooner if clinically indicated.  ?Patient advised to contact office with any questions, adverse effects, or acute worsening in signs and symptoms. ? ? ? ?Amiyah was seen today for anxiety. ? ?Diagnoses and all orders for this visit: ? ?Generalized anxiety disorder ?-  cloNIDine (CATAPRES) 0.1 MG tablet; Take 1/2 tablet at bedtime for 4-7 days, then can increase to 1 tablet at bedtime as tolerated ? ?Primary insomnia ?-     cloNIDine (CATAPRES) 0.1 MG tablet; Take 1/2 tablet at bedtime for 4-7 days, then can increase to 1 tablet at bedtime as tolerated ? ?  ? ?Please see After Visit Summary for patient specific instructions. ? ?Future Appointments  ?Date Time Provider Department Center  ?02/19/2022 10:15 AM Jerene Bears, MD [facility]  ?03/07/2022  4:00 PM [name], [facility] CP[facility] None  ?03/14/2022 10:00 AM Corie Chiquito, [facility]NP [facility] None  ?04/10/2022 11:00 AM [name], [facility] None  ? ? ?No orders of the defined types were placed in this encounter. ? ? ?------------------------------- ?

## 2022-02-13 ENCOUNTER — Ambulatory Visit: Payer: BC Managed Care – PPO | Admitting: Psychiatry

## 2022-02-13 DIAGNOSIS — F411 Generalized anxiety disorder: Secondary | ICD-10-CM | POA: Diagnosis not present

## 2022-02-13 NOTE — Progress Notes (Signed)
?      [facility] Counselor/Therapist Progress Note ? ?Patient ID: Mariah [name], MRN: [id number],   ? ?Date: 02/13/2022 ? ?Time Spent: 50 minutes start time 4:04 PM end time 4:54 PM ? ?Treatment Type: Individual Therapy ? ?Reported Symptoms: anxiety, sleep issues ? ?Mental Status Exam: ? ?Appearance:   Casual and Neat     ?Behavior:  Appropriate  ?Motor:  Normal  ?Speech/Language:   Normal Rate  ?Affect:  Appropriate  ?Mood:  anxious  ?Thought process:  normal  ?Thought content:    WNL  ?Sensory/Perceptual disturbances:    WNL  ?Orientation:  oriented to person, place, time/date, and situation  ?Attention:  Good  ?Concentration:  Good  ?Memory:  WNL  ?Fund of knowledge:   Good  ?Insight:    Good  ?Judgment:   Good  ?Impulse Control:  Good  ? ?Risk Assessment: ?Danger to Self:  No ?Self-injurious Behavior: No ?Danger to Others: No ?Duty to Warn:no ?Physical Aggression / Violence:No  ?Access to Firearms a concern: No  ?Gang Involvement:No  ? ?Subjective: Patient was present for session.  She shared that her mother in law is the same.  She shared that her mother is starting to struggle with getting her words out and she is wondering if she needs a neurology evaluation.  She went on to share that since she is her sister's care taker she is wondering what she needs to do.  Encouraged her to consult with an attorney.  Patient also shared she is having lots of issues regarding her body she shared she feels extremely exhausted and is unable to do the amount of exercise she typically does.  She shared even get ready for the session was very overwhelming for her and exhausting.  Patient was encouraged to continue working with her physicians to make sure that everything is going the way it supposed to.  From there she was encouraged to find ways to continue releasing her stress and trying to focus on the positives so that she can move in a positive direction. ? ?Interventions: Solution-Oriented/Positive  Psychology ? ?Diagnosis: ?  ICD-10-CM   ?1. Generalized anxiety disorder  F41.1   ?  ? ? ?Plan: Patient is to use CBT and coping skills to decrease anxiety symptoms.  Patient is to continue focusing on the things that she can control fix and change.  Patient is to continue exercising and working on self-care to release negative emotions appropriately.  Patient is to take her supplements and medication appropriately to make sure that she is able to sleep at least a couple nights a week.  Patient is to take medication as directed. ?Long-term goal: Enhance ability to handle effectively the full variety of life's anxieties ?Short-term goal: Identify the major life complex in the past and present the form the basis for present anxiety.  Verbalize an understanding of the role that fearful thinking plays in creating fears excessive worry and persistent anxiety ? ?[name], [facility] ? ? ? ? ? ? ? ? ? ? ? ? ? ? ? ? ? ? ?

## 2022-02-19 ENCOUNTER — Ambulatory Visit (INDEPENDENT_AMBULATORY_CARE_PROVIDER_SITE_OTHER): Payer: BC Managed Care – PPO | Admitting: Obstetrics & Gynecology

## 2022-02-19 ENCOUNTER — Encounter (HOSPITAL_BASED_OUTPATIENT_CLINIC_OR_DEPARTMENT_OTHER): Payer: Self-pay | Admitting: Obstetrics & Gynecology

## 2022-02-19 VITALS — BP 133/82 | HR 84 | Ht 64.75 in | Wt 121.4 lb

## 2022-02-19 DIAGNOSIS — R002 Palpitations: Secondary | ICD-10-CM

## 2022-02-19 DIAGNOSIS — Z780 Asymptomatic menopausal state: Secondary | ICD-10-CM | POA: Diagnosis not present

## 2022-02-19 DIAGNOSIS — F411 Generalized anxiety disorder: Secondary | ICD-10-CM

## 2022-02-19 DIAGNOSIS — Z01419 Encounter for gynecological examination (general) (routine) without abnormal findings: Secondary | ICD-10-CM

## 2022-02-19 DIAGNOSIS — N952 Postmenopausal atrophic vaginitis: Secondary | ICD-10-CM

## 2022-02-19 DIAGNOSIS — M810 Age-related osteoporosis without current pathological fracture: Secondary | ICD-10-CM

## 2022-02-19 NOTE — Progress Notes (Signed)
61 y.o. G52P2002 Married White or Caucasian female here for annual exam.  Having a lot of issues with anxiety.  She reports she had a respiratory virus in January and she just hasn't felt great since then.  She was tested for Covid but it took several days to have an appointment.  She never ran a fever.  She's tried lexapro, paxil, seroquel, gabapentin, clonidine.  Does have follow up scheduled in early June.  Had side effects to this.  Has used Zoloft in the past.  Currently, lorazpam is what has helped but she's nervous about being on this long term.  Daughter has a lot of medication sensitivities.  Is seeing a provider at [facility].    She reports she is still exercising but she used to feel better with exercise.  She reports she feels worse.  She is feeling palpitations as well.  She's nervous about her heart.    Patient's last menstrual period was 07/14/2011.          Sexually active: not currently The current method of family planning is post menopausal status.    Exercising: yes Smoker:  no  Health Maintenance: Pap:  10/13/2019 Negative with neg HR HPV History of abnormal Pap:  no MMG:  05/22/2021 Negative Colonoscopy:  03/07/2021, follow up 10 years BMD:   03/16/2020 Osteoporosis in right femoral neck Screening Labs: 06/2021   reports that she quit smoking about 38 years ago. Her smoking use included cigarettes. She has never used smokeless tobacco. She reports that she does not drink alcohol and does not use drugs.  Past Medical History:  Diagnosis Date   Anxiety    Osteopenia    Vision abnormalities     Past Surgical History:  Procedure Laterality Date   DILATION AND CURETTAGE OF UTERUS      Current Outpatient Medications  Medication Sig Dispense Refill   CALCIUM PO Take by mouth.     COVID-19 mRNA Vac-TriS, Pfizer, (PFIZER-BIONT COVID-19 VAC-TRIS) SUSP injection Inject into the muscle. 0.3 mL 0   LORazepam (ATIVAN) 0.5 MG tablet Take 1/2-1 tab po TID prn anxiety and  insomnia 90 tablet 0   MELATONIN ER PO Take by mouth.     Multiple Vitamin (ONE DAILY) tablet Take by mouth.     Multiple Vitamins-Minerals (ZINC PO) Take by mouth.     Omega-3 Fatty Acids (FISH OIL PO) Take by mouth.     TURMERIC PO Take by mouth.     VITAMIN D PO Take by mouth.     No current facility-administered medications for this visit.    Family History  Problem Relation Age of Onset   Hypertension Mother    Skin cancer Mother    Melanoma Father    Multiple myeloma Father    Bipolar disorder Sister    Heart disease Maternal Grandmother    Heart disease Paternal Grandmother    Anxiety disorder Daughter    Depression Son    ROS: Genitourinary:negative  Exam:   BP 133/82 (BP Location: Left Arm, Patient Position: Sitting, Cuff Size: Normal)   Pulse 84   Ht 5' 4.75" (1.645 m)   Wt 121 lb 6.4 oz (55.1 kg)   LMP 07/14/2011   BMI 20.36 kg/m   Height: 5' 4.75" (164.5 cm)  General appearance: alert, cooperative and appears stated age Head: Normocephalic, without obvious abnormality, atraumatic Neck: no adenopathy, supple, symmetrical, trachea midline and thyroid normal to inspection and palpation Lungs: clear to auscultation bilaterally Breasts: normal appearance, no  masses or tenderness Heart: regular rate and rhythm Abdomen: soft, non-tender; bowel sounds normal; no masses,  no organomegaly Extremities: extremities normal, atraumatic, no cyanosis or edema Skin: Skin color, texture, turgor normal. No rashes or lesions Lymph nodes: Cervical, supraclavicular, and axillary nodes normal. No abnormal inguinal nodes palpated Neurologic: Grossly normal   Pelvic: External genitalia:  no lesions              Urethra:  normal appearing urethra with no masses, tenderness or lesions              Bartholins and Skenes: normal                 Vagina: normal appearing vagina with normal color and no discharge, no lesions              Cervix: no lesions              Pap taken:  No. Bimanual Exam:  Uterus:  normal size, contour, position, consistency, mobility, non-tender              Adnexa: normal adnexa and no mass, fullness, tenderness               Rectovaginal: Confirms               Anus:  normal sphincter tone, no lesions  Chaperone, Octaviano Batty, CMA, was present for exam.  Assessment/Plan: 1. Well woman exam with routine gynecological exam - pap and HR HPV 2021 eith neg HR HPV.  Plan to repeat next year. - MMG up to date - colonoscopy 2022 - BMD will be planned next year - vaccines reviewed.  Has not done shingles vaccine  2. Palpitations - Ambulatory referral to Cardiology - TSH  3. Postmenopausal - no HRT  4. Generalized anxiety disorder - is being followed by [facility]  5. Vaginal atrophy  6. Age-related osteoporosis without current pathological fracture - plan to repeat next year

## 2022-02-20 LAB — TSH: TSH: 1.03 u[IU]/mL (ref 0.450–4.500)

## 2022-03-07 ENCOUNTER — Ambulatory Visit: Payer: BC Managed Care – PPO | Admitting: Psychiatry

## 2022-03-07 DIAGNOSIS — F411 Generalized anxiety disorder: Secondary | ICD-10-CM

## 2022-03-07 NOTE — Progress Notes (Signed)
[facility] Counselor/Therapist Progress Note  Patient ID: Mariah [name], MRN: [id number],    Date: 03/07/2022  Time Spent: 56 minutes start time 4:06 PM end time 4:56 PM  Treatment Type: Individual Therapy  Reported Symptoms: anxiety, sleep issues, physical issues, fatigue  Mental Status Exam:  Appearance:   Casual and Neat     Behavior:  Appropriate  Motor:  Normal  Speech/Language:   Normal Rate  Affect:  Appropriate  Mood:  anxious  Thought process:  normal  Thought content:    WNL  Sensory/Perceptual disturbances:    WNL  Orientation:  oriented to person, place, time/date, and situation  Attention:  Good  Concentration:  Good  Memory:  WNL  Fund of knowledge:   Good  Insight:    Good  Judgment:   Good  Impulse Control:  Good   Risk Assessment: Danger to Self:  No Self-injurious Behavior: No Danger to Others: No Duty to Warn:no Physical Aggression / Violence:No  Access to Firearms a concern: No  Gang Involvement:No   Subjective: Patient was present for session. She shared that she has been referred to [facility].  Discussed how that could be a good thing for her.  Patient explained she is still very frustrated because it seems that her body has a sensitive to response to everything including supplements so that spent difficult for her to manage.  Patient was encouraged to take things 1 step at a time and to continue working with providers on how to manage things.  [place] and she is hopeful that things will continue to go well when she her kids and their significant others all get together over July 4.  Patient went on to share that she is going to have to work with her sisters therapist on how to become her power of attorney.  She shared that it is extremely overwhelming for her.  Encouraged her to take things 1 step at a time as she deals with the situation.  She also shared that her  mother-in-law continues to have different emergencies and her husband is not able to really relax and plan a trip with her due to being on call 24/7.  Encouraged patient to talk to him about doing day trips so that they can still have time connecting rather than not having any trips at all.  Patient was encouraged to continue taking trips as needed with her friends so that she can still enjoy what she is wanting to.  Patient shared that she is not sure always how to change things with her husband.  Encouraged her to start focusing on the positives and finding moments to connect even when it is very difficult.  Patient agreed to work on plans from session and to continue exercising and eating healthy.  Interventions: Cognitive Behavioral Therapy and Solution-Oriented/Positive Psychology  Diagnosis:   ICD-10-CM   1. Generalized anxiety disorder  F41.1       Plan: Patient is to use CBT and coping skills to decrease anxiety symptoms.  Patient is to continue focusing on the things that she can control fix and change.  Patient is to continue exercising and working on self-care to release negative emotions appropriately.  Patient is to work on setting up small trips for she and her husband to enjoy and work on connecting.  Patient is to take her supplements and medication appropriately to make sure that she is  able to sleep at least a couple nights a week.  Patient is to take medication as directed. Long-term goal: Enhance ability to handle effectively the full variety of life's anxieties Short-term goal: Identify the major life complex in the past and present the form the basis for present anxiety.  Verbalize an understanding of the role that fearful thinking plays in creating fears excessive worry and persistent anxiety  [name], [facility]

## 2022-03-09 ENCOUNTER — Encounter (HOSPITAL_BASED_OUTPATIENT_CLINIC_OR_DEPARTMENT_OTHER): Payer: Self-pay | Admitting: Cardiology

## 2022-03-09 ENCOUNTER — Ambulatory Visit (HOSPITAL_BASED_OUTPATIENT_CLINIC_OR_DEPARTMENT_OTHER): Payer: BC Managed Care – PPO | Admitting: Cardiology

## 2022-03-09 ENCOUNTER — Ambulatory Visit (INDEPENDENT_AMBULATORY_CARE_PROVIDER_SITE_OTHER): Payer: BC Managed Care – PPO

## 2022-03-09 VITALS — BP 110/83 | HR 84 | Ht 64.0 in | Wt 123.5 lb

## 2022-03-09 DIAGNOSIS — Z136 Encounter for screening for cardiovascular disorders: Secondary | ICD-10-CM | POA: Diagnosis not present

## 2022-03-09 DIAGNOSIS — R0989 Other specified symptoms and signs involving the circulatory and respiratory systems: Secondary | ICD-10-CM | POA: Diagnosis not present

## 2022-03-09 DIAGNOSIS — Z7189 Other specified counseling: Secondary | ICD-10-CM | POA: Diagnosis not present

## 2022-03-09 DIAGNOSIS — R002 Palpitations: Secondary | ICD-10-CM

## 2022-03-09 NOTE — Progress Notes (Signed)
Cardiology Office Note:    Date:  03/09/2022   ID:  Mariah [name], DOB 06/19/1961, MRN [id number]  PCP:  Loraine Leriche., MD  Cardiologist:  Buford Dresser, MD  Referring MD: Megan Salon, MD   CC: new patient consultation for palpitations/tachycardia  History of Present Illness:    Mariah [name] is a 61 y.o. female with a hx of osteopenia and anxiety who is seen as a new consult at the request of Megan Salon, MD for the evaluation and management of tachycardia.  She went to a Well-woman exam on 02/19/22 with Hale Bogus, MD. She reported palpitations and feeling worse with exercise, and was referred to cardiology.    Tachycardia/palpitations: The feeling of "heart racing." -Initial onset: January 2023 following two week respiratory virus -Frequency/Duration: not every night, but more nights than not -Associated symptoms: none -Aggravating/alleviating factors: The palpitations are mostly at night, but occur sometimes in the day. Sleeping on her left side makes palpatations more noticeable. -Syncope/near syncope: No -Comorbidities: anxiety -Exercise level: She is a Sales executive. She often used to play multiple games in a day. Now, she often has to sit and rest between games, which is unlike her. Since her respiratory infection in January, after which she has experienced persistent fatigue, which impacts her ability to exercise. The palpitations do not prevent physical exercise.  -Labs: TSH, kidney function/electrolytes, CBC reviewed. -Cardiac ROS: no chest pain, no shortness of breath, no PND, no orthopnea, no LE edema. -Family history:  Her grandmother had heart issues. All maternal females in family have hypertension.  Today:  She has had some episodes with spikes in her blood pressure. She wakes up in the middle of the night feeling poorly, and checks her blood pressure, which then runs very high, into the 150s/90s. Her blood pressure comes back down quickly after  spiking. This often happens when she is sleeping or eating, rather than when she is being active. Aside from her blood pressure spikes, her blood pressure is usually 110-120/60-70.  She has been experiencing insomnia since going into menopause.  The more she exercises, the worse she feels.  She just had cataract surgery.  She denies any chest pain, shortness of breath, or peripheral edema. No lightheadedness, headaches, syncope, orthopnea, or PND.  Past Medical History:  Diagnosis Date   Anxiety    Osteopenia    Vision abnormalities     Past Surgical History:  Procedure Laterality Date   DILATION AND CURETTAGE OF UTERUS      Current Medications: Current Outpatient Medications on File Prior to Visit  Medication Sig   CALCIUM PO Take by mouth.   COVID-19 mRNA Vac-TriS, Pfizer, (PFIZER-BIONT COVID-19 VAC-TRIS) SUSP injection Inject into the muscle.   LORazepam (ATIVAN) 0.5 MG tablet Take 1/2-1 tab po TID prn anxiety and insomnia   MELATONIN ER PO Take by mouth.   Multiple Vitamin (ONE DAILY) tablet Take by mouth.   Multiple Vitamins-Minerals (ZINC PO) Take by mouth.   Omega-3 Fatty Acids (FISH OIL PO) Take by mouth.   TURMERIC PO Take by mouth.   VITAMIN D PO Take by mouth.   No current facility-administered medications on file prior to visit.     Allergies:   Patient has no known allergies.   Social History   Tobacco Use   Smoking status: Former    Types: Cigarettes    Quit date: 10/02/1983    Years since quitting: 38.4   Smokeless tobacco: Never  Vaping Use  Vaping Use: Never used  Substance Use Topics   Alcohol use: No   Drug use: No    Family History: family history includes Anxiety disorder in her daughter; Bipolar disorder in her sister; Depression in her son; Heart disease in her maternal grandmother and paternal grandmother; Hypertension in her mother; Melanoma in her father; Multiple myeloma in her father; Skin cancer in her mother.  ROS:   Please see  the history of present illness.  Additional pertinent ROS: Constitutional: Negative for chills, fever, night sweats, unintentional weight loss  HENT: Negative for ear pain and hearing loss.   Eyes: Negative for loss of vision and eye pain.  Respiratory: Negative for cough, sputum, wheezing.   Cardiovascular: See HPI. Spikes in blood pressure and palpitations. Gastrointestinal: Negative for abdominal pain, melena, and hematochezia.  Genitourinary: Negative for dysuria and hematuria.  Musculoskeletal: Negative for falls and myalgias.  Skin: Negative for itching and rash.  Neurological: Negative for focal weakness, focal sensory changes and loss of consciousness. Positive for insomnia and anxiety. Endo/Heme/Allergies: Does not bruise/bleed easily.     EKGs/Labs/Other Studies Reviewed:    The following studies were reviewed today:  No prior cardiovascular studies available.  EKG:  EKG is personally reviewed.   03/09/22: NSR at 84 bpm  Recent Labs: 02/19/2022: TSH 1.030   Recent Lipid Panel No results found for: "CHOL", "TRIG", "HDL", "CHOLHDL", "VLDL", "LDLCALC", "LDLDIRECT"  Physical Exam:    VS:  BP 110/83   Pulse 84   Ht '5\' 4"'  (1.626 m)   Wt 123 lb 8 oz (56 kg)   LMP 07/14/2011   SpO2 99%   BMI 21.20 kg/m     Wt Readings from Last 3 Encounters:  03/09/22 123 lb 8 oz (56 kg)  02/19/22 121 lb 6.4 oz (55.1 kg)  09/28/21 124 lb (56.2 kg)    GEN: Well nourished, well developed in no acute distress HEENT: Normal, moist mucous membranes NECK: No JVD CARDIAC: regular rhythm, normal S1 and S2, no rubs or gallops. No murmur. VASCULAR: Radial and DP pulses 2+ bilaterally. No carotid bruits RESPIRATORY:  Clear to auscultation without rales, wheezing or rhonchi  ABDOMEN: Soft, non-tender, non-distended MUSCULOSKELETAL:  Ambulates independently SKIN: Warm and dry, no edema NEUROLOGIC:  Alert and oriented x 3. No focal neuro deficits noted. PSYCHIATRIC:  Normal affect     ASSESSMENT:    1. Palpitations   2. Labile blood pressure   3. Cardiac risk counseling   4. Screening for heart disease   5. Counseling on health promotion and disease prevention    PLAN:    Palpitations: -we discussed the potential causes of fast heart rates and palpitations today.  -will get 7 day Zio monitor, instructed on use  Labile blood pressure: -no history of hypertension. Based on description, when she feels poorly and then checks her BP, it is mildly elevated and improves on recheck. This suggests that the BP is a response to feeling poorly rather than the cause of it  Screening for heart disease -we reviewed the pros/cons of calcium scores. A cardiac CT scan for coronary calcium is a non-invasive way of obtaining information about the presence, location and extent of calcified plaque in the coronary arteries--the vessels that supply oxygen-containing blood to the heart muscle. Calcified plaque results when there is a build-up of fat and other substances under the inner layer of the artery. This material can calcify which signals the presence of atherosclerosis, a disease of the vessel wall, also called  coronary artery disease.  People with this disease have an increased risk for heart attacks. In addition, over time, progression of plaque build up (CAD) can narrow the arteries or even close off blood flow to the heart. Because calcium is a marker of CAD, the amount of calcium detected on a cardiac CT scan is a helpful prognostic tool.  -we reviewed the charts together which show the relationship between calcium score and 15 year all cause mortality -after shared decision making, will proceed with coronary calcium score. They understand this is an out of pocket/self pay test currently costing $99.  -if calcium score nonzero, we did preliminarily discuss statin and the data for benefit  Cardiac risk counseling and prevention recommendations: -recommend heart  healthy/Mediterranean diet, with whole grains, fruits, vegetable, fish, lean meats, nuts, and olive oil. Limit salt. -recommend moderate walking, 3-5 times/week for 30-50 minutes each session. Aim for at least 150 minutes.week. Goal should be pace of 3 miles/hours, or walking 1.5 miles in 30 minutes -recommend avoidance of tobacco products. Avoid excess alcohol. -ASCVD risk score: The 10-year ASCVD risk score (Arnett DK, et al., 2019) is: 2.6%   Values used to calculate the score:     Age: 61 years     Sex: Female     Is Non-Hispanic African American: No     Diabetic: No     Tobacco smoker: No     Systolic Blood Pressure: 361 mmHg     Is BP treated: No     HDL Cholesterol: 95 MG/DL     Total Cholesterol: 212 MG/DL    Plan for follow up: tbd  Buford Dresser, MD, PhD, [facility]    Medication Adjustments/Labs and Tests Ordered: Current medicines are reviewed at length with the patient today.  Concerns regarding medicines are outlined above.   Orders Placed This Encounter  Procedures   CT CARDIAC SCORING (SELF PAY ONLY)   LONG TERM MONITOR (3-14 DAYS)   EKG 12-Lead   No orders of the defined types were placed in this encounter.  Patient Instructions  Medication Instructions:  Your physician recommends that you continue on your current medications as directed. Please refer to the Current Medication list given to you today.  *If you need a refill on your cardiac medications before your next appointment, please call your pharmacy*  Lab Work: NONE   Testing/Procedures: CALCIUM SCORE - THIS WILL COST YOU $99 OUT OF POCKET AFTER THE MONITOR   7 DAY MONITOR   Follow-Up: TO BE DETERMINED AFTER TESTING    ZIO XT- Long Term Monitor Instructions  Your physician has requested you wear a ZIO patch monitor for 7 days.  This is a single patch monitor. [employer] supplies one patch monitor per enrollment. Additional stickers are not available. Please do  not apply patch if you will be having a Nuclear Stress Test,  Echocardiogram, Cardiac CT, MRI, or Chest Xray during the period you would be wearing the  monitor. The patch cannot be worn during these tests. You cannot remove and re-apply the  ZIO XT patch monitor.  Your ZIO patch monitor will be mailed 3 day USPS to your address on file. It may take 3-5 days  to receive your monitor after you have been enrolled.  Once you have received your monitor, please review the enclosed instructions. Your monitor  has already been registered assigning a specific monitor serial # to you.  Billing and Patient Assistance Program Information  We have  supplied [employer] with any of your insurance information on file for billing purposes. [employer] offers a sliding scale Patient Assistance Program for patients that do not have  insurance, or whose insurance does not completely cover the cost of the ZIO monitor.  You must apply for the Patient Assistance Program to qualify for this discounted rate.  To apply, please call [employer] at [phone], select option 4, select option 2, ask to apply for  Patient Assistance Program. Theodore Demark will ask your household income, and how many people  are in your household. They will quote your out-of-pocket cost based on that information.  [employer] will also be able to set up a 41-month interest-free payment plan if needed.  Applying the monitor   Shave hair from upper left chest.  Hold abrader disc by orange tab. Rub abrader in 40 strokes over the upper left chest as  indicated in your monitor instructions.  Clean area with 4 enclosed alcohol pads. Let dry.  Apply patch as indicated in monitor instructions. Patch will be placed under collarbone on left  side of chest with arrow pointing upward.  Rub patch adhesive wings for 2 minutes. Remove white label marked "1". Remove the white  label marked "2". Rub patch adhesive wings for 2 additional minutes.  While looking in a  mirror, press and release button in center of patch. A small green light will  flash 3-4 times. This will be your only indicator that the monitor has been turned on.  Do not shower for the first 24 hours. You may shower after the first 24 hours.  Press the button if you feel a symptom. You will hear a small click. Record Date, Time and  Symptom in the Patient Logbook.  When you are ready to remove the patch, follow instructions on the last 2 pages of Patient  Logbook. Stick patch monitor onto the last page of Patient Logbook.  Place Patient Logbook in the blue and white box. Use locking tab on box and tape box closed  securely. The blue and white box has prepaid postage on it. Please place it in the mailbox as  soon as possible. Your physician should have your test results approximately 7 days after the  [facility]  Call [place] [phone] you have questions regarding  your ZIO XT patch monitor. Call them immediately if you see an orange light blinking on your  monitor.  If your monitor falls off in less than 4 days, contact our Monitor department at [phone]  If your monitor becomes loose or falls off after 4 days call [employer] at [phone]  suggestions on securing your monitor      I,Mathew Stumpf,acting as a scribe for BBuford Dresser MD.,have documented all relevant documentation on the behalf of BBuford Dresser MD,as directed by  BBuford Dresser MD while in the presence of BBuford Dresser MD.  I, BBuford Dresser MD, have reviewed all documentation for this visit. The documentation on 03/09/22 for the exam, diagnosis, procedures, and orders are all accurate and complete.   Signed, BBuford Dresser MD PhD 03/09/2022 1:26 PM    [facility]

## 2022-03-09 NOTE — Patient Instructions (Addendum)
Medication Instructions:  Your physician recommends that you continue on your current medications as directed. Please refer to the Current Medication list given to you today.  *If you need a refill on your cardiac medications before your next appointment, please call your pharmacy*  Lab Work: NONE   Testing/Procedures: CALCIUM SCORE - THIS WILL COST YOU $99 OUT OF POCKET AFTER THE MONITOR   7 DAY MONITOR   Follow-Up: TO BE DETERMINED AFTER TESTING    ZIO XT- Long Term Monitor Instructions  Your physician has requested you wear a ZIO patch monitor for 7 days.  This is a single patch monitor. [employer] supplies one patch monitor per enrollment. Additional stickers are not available. Please do not apply patch if you will be having a Nuclear Stress Test,  Echocardiogram, Cardiac CT, MRI, or Chest Xray during the period you would be wearing the  monitor. The patch cannot be worn during these tests. You cannot remove and re-apply the  ZIO XT patch monitor.  Your ZIO patch monitor will be mailed 3 day USPS to your address on file. It may take 3-5 days  to receive your monitor after you have been enrolled.  Once you have received your monitor, please review the enclosed instructions. Your monitor  has already been registered assigning a specific monitor serial # to you.  Billing and Patient Assistance Program Information  We have supplied [employer] with any of your insurance information on file for billing purposes. [employer] offers a sliding scale Patient Assistance Program for patients that do not have  insurance, or whose insurance does not completely cover the cost of the ZIO monitor.  You must apply for the Patient Assistance Program to qualify for this discounted rate.  To apply, please call [employer] at [phone], select option 4, select option 2, ask to apply for  Patient Assistance Program. Theodore Demark will ask your household income, and how many people  are in your household. They will  quote your out-of-pocket cost based on that information.  [employer] will also be able to set up a 17-month, interest-free payment plan if needed.  Applying the monitor   Shave hair from upper left chest.  Hold abrader disc by orange tab. Rub abrader in 40 strokes over the upper left chest as  indicated in your monitor instructions.  Clean area with 4 enclosed alcohol pads. Let dry.  Apply patch as indicated in monitor instructions. Patch will be placed under collarbone on left  side of chest with arrow pointing upward.  Rub patch adhesive wings for 2 minutes. Remove white label marked "1". Remove the white  label marked "2". Rub patch adhesive wings for 2 additional minutes.  While looking in a mirror, press and release button in center of patch. A small green light will  flash 3-4 times. This will be your only indicator that the monitor has been turned on.  Do not shower for the first 24 hours. You may shower after the first 24 hours.  Press the button if you feel a symptom. You will hear a small click. Record Date, Time and  Symptom in the Patient Logbook.  When you are ready to remove the patch, follow instructions on the last 2 pages of Patient  Logbook. Stick patch monitor onto the last page of Patient Logbook.  Place Patient Logbook in the blue and white box. Use locking tab on box and tape box closed  securely. The blue and white box has prepaid postage on it. Please place it  in the mailbox as  soon as possible. Your physician should have your test results approximately 7 days after the  monitor has been mailed back to [facility].  Call [facility] at [phone] if you have questions regarding  your ZIO XT patch monitor. Call them immediately if you see an orange light blinking on your  monitor.  If your monitor falls off in less than 4 days, contact our Monitor department at [phone].  If your monitor becomes loose or falls off after 4 days call [employer]  at [phone] for  suggestions on securing your monitor

## 2022-03-14 ENCOUNTER — Encounter: Payer: Self-pay | Admitting: Psychiatry

## 2022-03-14 ENCOUNTER — Ambulatory Visit: Payer: BC Managed Care – PPO | Admitting: Psychiatry

## 2022-03-14 DIAGNOSIS — F5101 Primary insomnia: Secondary | ICD-10-CM

## 2022-03-14 DIAGNOSIS — F411 Generalized anxiety disorder: Secondary | ICD-10-CM | POA: Diagnosis not present

## 2022-03-14 MED ORDER — VILAZODONE HCL 10 MG PO TABS: 5.0000 mg | ORAL_TABLET | Freq: Every day | ORAL | 1 refills | Status: DC

## 2022-03-14 NOTE — Progress Notes (Signed)
Mariah [name] [id number] [birth year] 61 y.o.  Subjective:   Patient ID:  Mariah [name] is a 61 y.o. (DOB [birth year]) female.  Chief Complaint:  Chief Complaint  Patient presents with   Anxiety   Insomnia    HPI Mariah [name] presents to the office today for follow-up of anxiety.   She is accompanied by her husband. She reports that she is "not good." She reports that she is not taking Lorazepam very often. She reports that she took Lorazepam recently and went to sleep and woke up and hour later and then again around 4 am and at 6 am again and had vertigo. She has had anxiety about vertigo coming back. She reports that Lorazepam is no longer helping her sleep through the night. She and her husband report that her sleep has been disrupted. She reports that she has multiple middle of the night awakenings and may sleep for 4 hours, be awake for a couple of hours, and then return to sleep for 2 hours.   She reports that she has severe fatigue and medical provider has evaluated medical causes of fatigue. Thyroid levels were normal. EKG was normal. She is now wearing a heart monitor. She is having palpitations, mostly at night.   She reports frequent anxiety. Husband reports that he notices her anxiety seems to be more constant and "on edge pretty much all of the day." She reports frequent worry. Denies panic attacks. Denies depressed mood. Husband reports that he does not see signs of depression. He reports that her brow is often furrowed and seems to be in deep thought. Motivation has been ok. Appetite has been good. She reports that she has gained weight. She reports difficulty with concentration. Denies SI.   She reports that she took Clonidine briefly.   Past Psychiatric Medication Trials: Difficulty with starting SSRI's in the past (fatigue, difficulty with concentration, unable to function) Silenor Belsomra-side effects, minimal efficacy Ambien- Parasomnias Benadryl- excessive  somnolence Melatonin- ineffective Lexapro- adverse effects Paxil- Multiple side effects Zoloft-adverse effects Ativan- Effective, has grogginess that carried over the next day. Has taken 0.5 mg.  Gabapentin- Excessive somnolence Clonidine  PHQ2-9    Flowsheet Row Office Visit from 02/19/2022 in [facility]  PHQ-2 Total Score 0        Review of Systems:  Review of Systems  Constitutional:  Positive for fatigue.  Respiratory:  Negative for shortness of breath.   Cardiovascular:  Positive for palpitations. Negative for chest pain.       Was having periods of BP "spikes"  Musculoskeletal:  Negative for gait problem.  Psychiatric/Behavioral:         Please refer to HPI    Medications: I have reviewed the patient's current medications.  Current Outpatient Medications  Medication Sig Dispense Refill   Calcium Carb-Cholecalciferol (CALCIUM 600/VITAMIN D PO) Take by mouth.     Multiple Vitamin (ONE DAILY) tablet Take by mouth.     Omega-3 Fatty Acids (FISH OIL PO) Take by mouth.     TURMERIC PO Take by mouth.     Vilazodone HCl (VIIBRYD) 10 MG TABS Take 0.5 tablets (5 mg total) by mouth daily. 15 tablet 1   CALCIUM PO Take by mouth.     COVID-19 mRNA Vac-TriS, Pfizer, (PFIZER-BIONT COVID-19 VAC-TRIS) SUSP injection Inject into the muscle. 0.3 mL 0   LORazepam (ATIVAN) 0.5 MG tablet Take 1/2-1 tab po TID prn anxiety and insomnia 90 tablet 0   MELATONIN ER PO Take by mouth. (Patient  not taking: Reported on 03/14/2022)     Multiple Vitamins-Minerals (ZINC PO) Take by mouth. (Patient not taking: Reported on 03/14/2022)     No current facility-administered medications for this visit.    Medication Side Effects: None  Allergies: No Known Allergies  Past Medical History:  Diagnosis Date   Anxiety    Osteopenia    Vision abnormalities     Past Medical History, Surgical history, Social history, and Family history were reviewed and updated as appropriate.    Please see review of systems for further details on the patient's review from today.   Objective:   Physical Exam:  LMP 07/14/2011   Physical Exam Constitutional:      General: She is not in acute distress. Musculoskeletal:        General: No deformity.  Neurological:     Mental Status: She is alert and oriented to person, place, and time.     Coordination: Coordination normal.  Psychiatric:        Attention and Perception: Attention and perception normal. She does not perceive auditory or visual hallucinations.        Mood and Affect: Mood is anxious. Mood is not depressed. Affect is not labile, blunt, angry or inappropriate.        Speech: Speech normal.        Behavior: Behavior normal.        Thought Content: Thought content normal. Thought content is not paranoid or delusional. Thought content does not include homicidal or suicidal ideation. Thought content does not include homicidal or suicidal plan.        Cognition and Memory: Cognition and memory normal.        Judgment: Judgment normal.     Comments: Insight intact     Lab Review:     Component Value Date/Time   NA 141 06/28/2014 1618   K 4.0 06/28/2014 1618   CL 103 06/28/2014 1618   CO2 28 06/28/2014 1607   GLUCOSE 105 (H) 06/28/2014 1618   BUN 16 06/28/2014 1618   CREATININE 0.80 06/28/2014 1618   CALCIUM 9.5 06/28/2014 1607   GFRNONAA >90 06/28/2014 1607   GFRAA >90 06/28/2014 1607       Component Value Date/Time   WBC 8.1 06/28/2014 1607   RBC 4.27 06/28/2014 1607   HGB 13.9 06/28/2014 1618   HCT 41.0 06/28/2014 1618   PLT 227 06/28/2014 1607   MCV 87.8 06/28/2014 1607   MCH 30.2 06/28/2014 1607   MCHC 34.4 06/28/2014 1607   RDW 12.9 06/28/2014 1607   LYMPHSABS 1.7 06/28/2014 1607   MONOABS 0.6 06/28/2014 1607   EOSABS 0.1 06/28/2014 1607   BASOSABS 0.0 06/28/2014 1607    No results found for: "POCLITH", "LITHIUM"   No results found for: "PHENYTOIN", "PHENOBARB", "VALPROATE", "CBMZ"    .res Assessment: Plan:   Pt seen for 30 minutes and time spent discussing recommendations from staffing case with Dr. Jennelle Human. Discussed that she has had significant adverse effects with several SSRI's and appears to be unable to tolerate SSRI's, and therefore recommend considering treatment with medications that are not SSRI's, such as Viibryd. Reviewed potential benefits, risks, and side effects of Viibryd with pt and her husband. Discussed Trileptal may also be a possible treatment option. Also discussed that atypical antipsychotics are used off-label for treatment of anxiety.  Pt agrees to trial of Viibryd. Will start Viibryd 5 mg daily with breakfast for anxiety.  She reports reduced efficacy with Lorazepam prn recently. Discussed  option of switching to a different benzodiazepine, however she reports that she would like to continue with Lorazepam prn at this time.  Recommend continuing [facility] with [facility]  Pt to follow-up with this provider in 4 weeks or sooner if clinically indicated.  Patient advised to contact office with any questions, adverse effects, or acute worsening in signs and symptoms.  Marylu LundJanet was seen today for anxiety and insomnia.  Diagnoses and all orders for this visit:  Generalized anxiety disorder -     Vilazodone HCl (VIIBRYD) 10 MG TABS; Take 0.5 tablets (5 mg total) by mouth daily.  Primary insomnia     Please see After Visit Summary for patient specific instructions.  Future Appointments  Date Time Provider Department Center  03/22/2022  9:30 AM [facility] 1 [facility]  04/10/2022 11:00 AM Stevphen MeuseIngram, Holly[facility] None  04/10/2022 12:00 PM Corie Chiquitoarter, Bisma Klett, [facility] CP-CP None  05/15/2022  1:00 PM [facility]phen MeuseIngram, Holly[facility] None    No orders of the defined types were placed in this encounter.   -------------------------------

## 2022-03-22 ENCOUNTER — Other Ambulatory Visit (HOSPITAL_BASED_OUTPATIENT_CLINIC_OR_DEPARTMENT_OTHER): Payer: BC Managed Care – PPO

## 2022-03-25 ENCOUNTER — Encounter (HOSPITAL_BASED_OUTPATIENT_CLINIC_OR_DEPARTMENT_OTHER): Payer: Self-pay | Admitting: Obstetrics & Gynecology

## 2022-03-28 ENCOUNTER — Other Ambulatory Visit (HOSPITAL_BASED_OUTPATIENT_CLINIC_OR_DEPARTMENT_OTHER): Payer: Self-pay | Admitting: Obstetrics & Gynecology

## 2022-03-28 ENCOUNTER — Encounter (HOSPITAL_BASED_OUTPATIENT_CLINIC_OR_DEPARTMENT_OTHER): Payer: Self-pay

## 2022-03-28 DIAGNOSIS — M810 Age-related osteoporosis without current pathological fracture: Secondary | ICD-10-CM

## 2022-04-10 ENCOUNTER — Ambulatory Visit (INDEPENDENT_AMBULATORY_CARE_PROVIDER_SITE_OTHER): Payer: BC Managed Care – PPO | Admitting: Psychiatry

## 2022-04-10 ENCOUNTER — Encounter: Payer: Self-pay | Admitting: Psychiatry

## 2022-04-10 DIAGNOSIS — F411 Generalized anxiety disorder: Secondary | ICD-10-CM

## 2022-04-10 DIAGNOSIS — F5101 Primary insomnia: Secondary | ICD-10-CM | POA: Diagnosis not present

## 2022-04-10 MED ORDER — DOXEPIN HCL 3 MG PO TABS: ORAL_TABLET | ORAL | 1 refills | Status: DC

## 2022-04-10 MED ORDER — DAYVIGO 5 MG PO TABS: ORAL_TABLET | ORAL | 0 refills | Status: DC

## 2022-04-10 NOTE — Progress Notes (Signed)
Mariah [name] [id number] 10-14-1960 61 y.o.  Subjective:   Patient ID:  Mariah [name] is a 61 y.o. (DOB 1961/08/26) female.  Chief Complaint:  Chief Complaint  Patient presents with   Anxiety   Insomnia    HPI Mariah [name] presents to the office today for follow-up of anxiety and insomnia. She reports that she has had adverse effects with medications and does not want to try additional medications at this time. She reports that she felt Viibryd worsened her anxiety and insomnia. She started Viibryd 6/19. She tried Melatonin while taking Viibryd and continued to have high anxiety and insomnia.   She notices that being in the sun seems to help her at night and she sleeps better. [place] and was in the sun often. She started taking Melatonin 3 mg QHS then. She reports that her anxiety has not been as bad since then. She reports that she stopped taking Melatonin nightly since she felt groggy one day. She reports that she has been getting 4 hours of uninterrupted sleep and then sleep is intermittent and less restful after 4 hours.  Past Psychiatric Medication Trials: Difficulty with starting SSRI's in the past (fatigue, difficulty with concentration, unable to function) Silenor Belsomra-side effects, minimal efficacy Ambien- Parasomnias Benadryl- excessive somnolence Melatonin- ineffective Lexapro- adverse effects Paxil- Multiple side effects Zoloft-adverse effects Viibryd Ativan- Effective, has grogginess that carried over the next day. Has taken 0.5 mg.  Gabapentin- Excessive somnolence Clonidine  PHQ2-9    Flowsheet Row Office Visit from 02/19/2022 in [facility]  PHQ-2 Total Score 0        Review of Systems:  Review of Systems  Cardiovascular:        Reports less palpitations due to anxiety  Musculoskeletal:  Negative for gait problem.  Psychiatric/Behavioral:         Please refer to HPI    Medications: I have reviewed the  patient's current medications.  Current Outpatient Medications  Medication Sig Dispense Refill   Doxepin HCl 3 MG TABS Take 1/2-2 tabs po QHS 60 tablet 1   Lemborexant (DAYVIGO) 5 MG TABS Take 1/2-1 tab po QHS 10 tablet 0   Calcium Carb-Cholecalciferol (CALCIUM 600/VITAMIN D PO) Take by mouth.     CALCIUM PO Take by mouth.     COVID-19 mRNA Vac-TriS, Pfizer, (PFIZER-BIONT COVID-19 VAC-TRIS) SUSP injection Inject into the muscle. 0.3 mL 0   LORazepam (ATIVAN) 0.5 MG tablet Take 1/2-1 tab po TID prn anxiety and insomnia 90 tablet 0   MELATONIN ER PO Take by mouth. (Patient not taking: Reported on 03/14/2022)     Multiple Vitamin (ONE DAILY) tablet Take by mouth.     Multiple Vitamins-Minerals (ZINC PO) Take by mouth. (Patient not taking: Reported on 03/14/2022)     Omega-3 Fatty Acids (FISH OIL PO) Take by mouth.     TURMERIC PO Take by mouth.     No current facility-administered medications for this visit.    Medication Side Effects: Other: Worsening anxiety and insomnia with Viibryd  Allergies: No Known Allergies  Past Medical History:  Diagnosis Date   Anxiety    Osteopenia    Vision abnormalities     Past Medical History, Surgical history, Social history, and Family history were reviewed and updated as appropriate.   Please see review of systems for further details on the patient's review from today.   Objective:   Physical Exam:  LMP 07/14/2011   Physical Exam  Constitutional:      General: She is not in acute distress. Musculoskeletal:        General: No deformity.  Neurological:     Mental Status: She is alert and oriented to person, place, and time.     Coordination: Coordination normal.  Psychiatric:        Attention and Perception: Attention and perception normal. She does not perceive auditory or visual hallucinations.        Mood and Affect: Mood is anxious. Affect is not labile, blunt, angry or inappropriate.        Speech: Speech normal.        Behavior:  Behavior normal.        Thought Content: Thought content normal. Thought content is not paranoid or delusional. Thought content does not include homicidal or suicidal ideation. Thought content does not include homicidal or suicidal plan.        Cognition and Memory: Cognition and memory normal.        Judgment: Judgment normal.     Comments: Insight intact Mood is less anxious compared to last exam.     Lab Review:     Component Value Date/Time   NA 141 06/28/2014 1618   K 4.0 06/28/2014 1618   CL 103 06/28/2014 1618   CO2 28 06/28/2014 1607   GLUCOSE 105 (H) 06/28/2014 1618   BUN 16 06/28/2014 1618   CREATININE 0.80 06/28/2014 1618   CALCIUM 9.5 06/28/2014 1607   GFRNONAA >90 06/28/2014 1607   GFRAA >90 06/28/2014 1607       Component Value Date/Time   WBC 8.1 06/28/2014 1607   RBC 4.27 06/28/2014 1607   HGB 13.9 06/28/2014 1618   HCT 41.0 06/28/2014 1618   PLT 227 06/28/2014 1607   MCV 87.8 06/28/2014 1607   MCH 30.2 06/28/2014 1607   MCHC 34.4 06/28/2014 1607   RDW 12.9 06/28/2014 1607   LYMPHSABS 1.7 06/28/2014 1607   MONOABS 0.6 06/28/2014 1607   EOSABS 0.1 06/28/2014 1607   BASOSABS 0.0 06/28/2014 1607    No results found for: "POCLITH", "LITHIUM"   No results found for: "PHENYTOIN", "PHENOBARB", "VALPROATE", "CBMZ"   .res Assessment: Plan:    Patient seen for 30 minutes and time spent discussing strategies to treat anxiety, mood, and insomnia without medication.  Agreed that her anxiety, mood, and insomnia seems to be improved with increased sunlight and that consistent light therapy may be helpful with managing symptoms.  She reports that she has a light box at home and has not used it recently and is unsure when and how to use it.  Discussed using light box as early as possible morning for about 20 to 30 minutes, with the light box being about 12 inches away from her face.  Discussed avoiding using the light box later in the day and minimizing Blue light  exposure in the evening since this could disrupt sleep.  Recommended trying to spend at least a short amount of time outside daily if possible.  Discussed that trying to maintain a consistent schedule may also be helpful with regulating sleep wake cycle.  Agreed that exercise and physical activity also has been beneficial for her anxiety and sleep.  She plans to try to continue to exercise regularly.  Discussed trying yoga in the evening since she noticed improved sleep last night with doing yoga in the evening instead of the morning as usual.  She reports that she does not want to start any new  medications to treat anxiety, however she reports that she is open to trying medication to help her sleep since her anxiety is typically less with adequate sleep.  Discussed potential benefits, risks, and side effects of several treatment options for insomnia to include low-dose doxepin and Dayvigo.  Discussed that generic doxepin capsules are not available in doses less than 10 mg, however doxepin is available in tablet form at 3 and 6 mg and this typically causes less daytime somnolence compared to higher doses. Discussed potential benefits, risks, and and side effects of Dayvigo for insomnia.Will send in free trial offer for Dayvigo. Discussed that typical starting dose is 5 mg and it is also available in 10 mg. Recommended starting with 1/2 of a 5 mg tab and then increasing to 5 mg if needed/tolerated. Also discussed her questions about Melatonin in regards to extended release vs immediate, different doses, and formulations. She reports that she may also trial different forms of Melatonin.  Will continue Lorazepam 0.5 mg 1/2-1 tab po TID prn anxiety and insomnia.  Recommend continuing therapy with [name], [facility].  Pt to follow-up with this provider as needed.  Patient advised to contact office with any questions, adverse effects, or acute worsening in signs and symptoms.    Mariah [name] was seen today for anxiety  and insomnia.  Diagnoses and all orders for this visit:  Generalized anxiety disorder  Primary insomnia -     Lemborexant (DAYVIGO) 5 MG TABS; Take 1/2-1 tab po QHS -     Doxepin HCl 3 MG TABS; Take 1/2-2 tabs po QHS     Please see After Visit Summary for patient specific instructions.  Future Appointments  Date Time Provider Department Center  05/15/2022  1:00 PM [name], [facility] CP-CP None  05/29/2022  8:20 AM [facility] 1 [facility]  06/14/2022  9:00 AM [name], [facility] CP-CP None  03/18/2023  1:15 PM Jerene Bears, MD [facility]    No orders of the defined types were placed in this encounter.   -------------------------------

## 2022-04-10 NOTE — Progress Notes (Signed)
[facility] Counselor/Therapist Progress Note  Patient ID: Mariah [name], MRN: [id number],    Date: 04/10/2022  Time Spent: 58 minutes start time 11:04 AM end time 12:02 PM  Treatment Type: Individual Therapy  Reported Symptoms: anxiety, depression, fatigue, sleep issues, health issues  Mental Status Exam:  Appearance:   Well Groomed     Behavior:  Appropriate  Motor:  Normal  Speech/Language:   Normal Rate  Affect:  Appropriate  Mood:  anxious  Thought process:  normal  Thought content:    WNL  Sensory/Perceptual disturbances:    WNL  Orientation:  oriented to person, place, time/date, and situation  Attention:  Good  Concentration:  Good  Memory:  WNL  Fund of knowledge:   Good  Insight:    Good  Judgment:   Good  Impulse Control:  Good   Risk Assessment: Danger to Self:  No Self-injurious Behavior: No Danger to Others: No Duty to Warn:no Physical Aggression / Violence:No  Access to Firearms a concern: No  Gang Involvement:No   Subjective: Patient was present for session. She shared that she is still struggling with anxiety and sleep issues even though it is better right now. Discussed options with patient she agreed to work on things addressed. There are still issues with her mother and husband's mother. They were able to book a trip to [place] in May and she is going to [place] this week,  going to [employer], and she is going to [place].  She shared all those things are keeping her busy.  She went on to share that she is going to be meeting with her sisters therapist and start talking about taking over power of attorney.  Different questions and thoughts were discussed with patient.  She was encouraged to remind herself she has to take things 1 step at a time and that she does not have to feel that she is doing all of this on her own.  Discussed different ways that she can work on her own self-care as she transitions into this role.  Discussed the fact that it will  be stressful for her just like it is for her husband being the caretaker for her mother but if they focus on supporting each other and taking it 1 step at a time it should be manageable.  Patient was also encouraged to continue focusing on her self-care.  Interventions: Cognitive Behavioral Therapy, Solution-Oriented/Positive Psychology, and Insight-Oriented  Diagnosis:   ICD-10-CM   1. Generalized anxiety disorder  F41.1       Plan: Patient is to use CBT and coping skills to decrease anxiety symptoms.  Patient is to continue focusing on the things that she can control fix and change.  Patient is to continue exercising and working on self-care to release negative emotions appropriately.  Patient is to work on setting up small trips for she and her husband to enjoy and work on connecting.  Patient is to take her supplements and medication appropriately to make sure that she is able to sleep at least a couple nights a week.  Patient is to take medication as directed. Long-term goal: Enhance ability to handle effectively the full variety of life's anxieties Short-term goal: Identify the major life complex in the past and present the form the basis for present anxiety.  Verbalize an understanding of the role that fearful thinking plays in creating fears excessive worry and persistent anxiety  [name], [facility]

## 2022-04-11 ENCOUNTER — Telehealth: Payer: Self-pay

## 2022-04-11 ENCOUNTER — Other Ambulatory Visit (HOSPITAL_BASED_OUTPATIENT_CLINIC_OR_DEPARTMENT_OTHER): Payer: Self-pay | Admitting: Internal Medicine

## 2022-04-11 DIAGNOSIS — Z1231 Encounter for screening mammogram for malignant neoplasm of breast: Secondary | ICD-10-CM

## 2022-04-11 NOTE — Telephone Encounter (Signed)
Prior Authorization submitted and approved for DAYVIGO 5 Mg effective 03/12/2022-04/11/2023 with [employer] PA# [id number]

## 2022-04-14 MED ORDER — DOXEPIN HCL 3 MG PO TABS: ORAL_TABLET | ORAL | 1 refills | Status: DC

## 2022-04-18 ENCOUNTER — Telehealth: Payer: Self-pay

## 2022-04-18 NOTE — Telephone Encounter (Signed)
I tried to submit a prior authorization for DOXEPIN HCL 3 MG TABS but this medication isn't on her plan so they administratively canceled the request.   Will inform Shanda Bumps and follow up with recommendation.

## 2022-04-18 NOTE — Telephone Encounter (Signed)
Noted  

## 2022-05-15 ENCOUNTER — Ambulatory Visit: Payer: BC Managed Care – PPO | Admitting: Psychiatry

## 2022-05-15 DIAGNOSIS — F411 Generalized anxiety disorder: Secondary | ICD-10-CM | POA: Diagnosis not present

## 2022-05-15 NOTE — Progress Notes (Signed)
[facility] Counselor/Therapist Progress Note  Patient ID: Mariah [name], MRN: [id number],    Date: 05/15/2022  Time Spent: 50 minutes start time 1:05 PM end time 1:55 PM  Treatment Type: Individual Therapy  Reported Symptoms: anxiety, memory issues, sadness, sleep issues  Mental Status Exam:  Appearance:   Well Groomed     Behavior:  Appropriate  Motor:  Normal  Speech/Language:   Normal Rate  Affect:  Appropriate  Mood:  anxious  Thought process:  normal  Thought content:    WNL  Sensory/Perceptual disturbances:    WNL  Orientation:  oriented to person, place, time/date, and situation  Attention:  Good  Concentration:  Good  Memory:  WNL  Fund of knowledge:   Good  Insight:    Good  Judgment:   Good  Impulse Control:  Good   Risk Assessment: Danger to Self:  No Self-injurious Behavior: No Danger to Others: No Duty to Warn:no Physical Aggression / Violence:No  Access to Firearms a concern: No  Gang Involvement:No   Subjective: Patient was present for session.  She shared that she did well overall when she was gone on vacation.  She did meet with her sister's therapist and they are going to start working on patient having guardianship of her sister.  She shared that currently her mother is still driving so she isn't going to work on transportation.  Patient stated she is just working on taking it 1 step at a time and figures that things will continue to move forward with the whole issue.  Patient was encouraged to make sure she is taking care of herself and finds balance in the whole transition process.  Patient acknowledged that watching her husband being trapped with his mother she does not want to make the same mistakes and so she is trying to set up boundaries and limits within the situation.  She shared that her husband has made plans for them to go on vacation finally which is progress.  It is not until next May but at least something is on the books and she is  hopeful that he will be able to relax and get away from having to be on-call 24/7 for his elderly mother.  Encouraged patient to continue being with her social contacts that support her in doing what she needs to do to release the negative emotions appropriately  Interventions: Cognitive Behavioral Therapy and Solution-Oriented/Positive Psychology  Diagnosis:   ICD-10-CM   1. Generalized anxiety disorder  F41.1       Plan: Patient is to use CBT and coping skills to decrease anxiety symptoms.  Patient is to continue focusing on the things that she can control fix and change.  Patient is to make sure she gets enough sunlight or light went from her lamp daily to help decrease anxiety.  Patient is to continue exercising and working on self-care to release negative emotions appropriately.  Patient is to work on setting up small trips for she and her husband to enjoy and work on connecting.  Patient is to take her supplements and medication appropriately to make sure that she is able to sleep at least a couple nights a week.  Patient is to take medication as directed. Long-term goal: Enhance ability to handle effectively the full variety of life's anxieties Short-term goal: Identify the major life complex in the past and present the form the basis for present anxiety.  Verbalize an understanding of the role that fearful thinking plays  in creating fears excessive worry and persistent anxiety  [name], [facility]

## 2022-05-22 ENCOUNTER — Telehealth: Payer: Self-pay | Admitting: Psychiatry

## 2022-05-22 NOTE — Telephone Encounter (Signed)
LVM as requested

## 2022-05-22 NOTE — Telephone Encounter (Signed)
Pt starting having a vertigo episode Friday and is not sure what caused it.It has been lingering so she has to be careful with sleeping and moving her head too fast.She has had increased anxiety due to the fact she has to be careful about how she sleeps and is having trouble sleeping too.She has not had to take ativan in a while but wants to know if it's ok to take while having vertigo episodes

## 2022-05-22 NOTE — Telephone Encounter (Signed)
Please let her know it should be ok to take Lorazepam if she is having vertigo.

## 2022-05-22 NOTE — Telephone Encounter (Signed)
Pt called and said that she got vertigo last Friday. She wants to know if it is ok for her to take lorazapam while she is waiting for vertigo to get better. Please call her at [phone]

## 2022-05-24 ENCOUNTER — Encounter (HOSPITAL_BASED_OUTPATIENT_CLINIC_OR_DEPARTMENT_OTHER): Payer: Self-pay | Admitting: Obstetrics & Gynecology

## 2022-05-25 ENCOUNTER — Other Ambulatory Visit (HOSPITAL_BASED_OUTPATIENT_CLINIC_OR_DEPARTMENT_OTHER): Payer: Self-pay | Admitting: Obstetrics & Gynecology

## 2022-05-25 DIAGNOSIS — R42 Dizziness and giddiness: Secondary | ICD-10-CM

## 2022-05-28 ENCOUNTER — Encounter: Payer: Self-pay | Admitting: Physical Therapy

## 2022-05-28 ENCOUNTER — Ambulatory Visit: Payer: BC Managed Care – PPO | Attending: Obstetrics & Gynecology | Admitting: Physical Therapy

## 2022-05-28 DIAGNOSIS — R2681 Unsteadiness on feet: Secondary | ICD-10-CM | POA: Insufficient documentation

## 2022-05-28 DIAGNOSIS — H8112 Benign paroxysmal vertigo, left ear: Secondary | ICD-10-CM | POA: Insufficient documentation

## 2022-05-28 DIAGNOSIS — R42 Dizziness and giddiness: Secondary | ICD-10-CM | POA: Insufficient documentation

## 2022-05-28 NOTE — Therapy (Signed)
OUTPATIENT PHYSICAL THERAPY VESTIBULAR EVALUATION     Patient Name: Mariah [name] MRN: [id number] DOB:Jun 12, 1961, 61 y.o., female Today's Date: 05/28/2022  PCP: Cheron Schaumann., MD  REFERRING PROVIDER: Jerene Bears, MD    PT End of Session - 05/28/22 2026     Visit Number 1    Number of Visits 4    Date for PT Re-Evaluation 06/22/22    Authorization Type BCBS    PT Start Time 1020    PT Stop Time 1102    PT Time Calculation (min) 42 min    Activity Tolerance Patient tolerated treatment well    Behavior During Therapy WFL for tasks assessed/performed             Past Medical History:  Diagnosis Date   Anxiety    Osteopenia    Vision abnormalities    Past Surgical History:  Procedure Laterality Date   DILATION AND CURETTAGE OF UTERUS     Patient Active Problem List   Diagnosis Date Noted   Bursitis of both hips 08/18/2021   Chronic insomnia 08/19/2018   Generalized anxiety disorder 07/14/2018   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   Sciatica of left side without back pain 02/09/2015   Agoraphobia with panic attacks 07/05/2014   Cervical pain 06/30/2014   Essential (primary) hypertension 06/30/2014   Disturbance of skin sensation 06/28/2014   Anxiety 05/05/2014   Benign essential hematuria 05/05/2014   Hot flash, menopausal 05/05/2014    ONSET DATE: 05-18-22  REFERRING DIAG: R42 (ICD-10-CM) - Vertigo   THERAPY DIAG:  BPPV (benign paroxysmal positional vertigo), left  Unsteadiness on feet  Rationale for Evaluation and Treatment Rehabilitation  SUBJECTIVE:   SUBJECTIVE STATEMENT: Pt states she had episode in June with ringing in Lt ear; also had episode in June with "Lt ear vertigo"; pt describes ongoing dysequilibrium since June    This episode occurred on Friday am, 05-18-22 after taking a Yoga class on Wed. And then drove to [facility] on Thursday; had episode of vertigo when she got up Friday am; got husband to assist her with doing the Epley -  did 2 reps; says she knew anxiety was starting to hit - went to [facility]; tried not to sleep on Rt side; drove home on Saturday and rested on Sunday;  "my head is not normal"; went walking on [address] on Monday, went to pickleball practice on Tuesday morning which "was a mistake";  states she is very medication sensitive; sleep was not good rest of week; took Lorazepam; states she doesn't feel right - "my brain is out of whack" Pt accompanied by: self  PERTINENT HISTORY: anxiety, h/o BPPV July 2022   PAIN:  Are you having pain? No  PRECAUTIONS: None  WEIGHT BEARING RESTRICTIONS No  FALLS: Has patient fallen in last 6 months? No   PLOF: Independent  PATIENT GOALS resolve the vertigo  OBJECTIVE:   DIAGNOSTIC FINDINGS: N/A  COGNITION: Overall cognitive status: Within functional limits for tasks assessed   Cervical ROM:  WNL's   STRENGTH: WNL's   GAIT: Gait pattern: WFL Distance walked: 50 Assistive device utilized: None Level of assistance: Modified independence Comments: pt moving en bloc and very slowly at start of session  PATIENT SURVEYS:  FOTO Not captured by front office   VESTIBULAR ASSESSMENT   GENERAL OBSERVATION: pt is a 61 yr old lady with episodic vertigo that started 10 days ago; pt states she is not having the spinning sensation like she was having but feels  off balance and states her head doesn't feel right    SYMPTOM BEHAVIOR:   Subjective history: hx of previous episode in July 2022   Non-Vestibular symptoms: tinnitus   Type of dizziness: Spinning/Vertigo   Frequency: usually daily - depends on the movement   Duration: secs to minutes   Aggravating factors: Induced by position change: rolling to the right and rolling to the left   Relieving factors: head stationary and lying supine   Progression of symptoms: better      POSITIONAL TESTING: Right Dix-Hallpike: no nystagmus Left Dix-Hallpike: no nystagmus noted but pt reported dizziness with  delayed onset    MOTION SENSITIVITY:    Motion Sensitivity Quotient  Intensity: 0 = none, 1 = Lightheaded, 2 = Mild, 3 = Moderate, 4 = Severe, 5 = Vomiting  Intensity  1. Sitting to supine   2. Supine to L side   3. Supine to R side   4. Supine to sitting   5. L Hallpike-Dix 3  6. Up from L  3  7. R Hallpike-Dix 2  8. Up from R  2  9. Sitting, head  tipped to L knee   10. Head up from L  knee   11. Sitting, head  tipped to R knee   12. Head up from R  knee   13. Sitting head turns x5   14.Sitting head nods x5   15. In stance, 180  turn to L    16. In stance, 180  turn to R      VESTIBULAR TREATMENT:  Canalith Repositioning:   Epley Left: Number of Reps: 2, Response to Treatment: symptoms improved, and Comment: no nystagmus noted on either rep   PATIENT EDUCATION: Education details: etiology of BPPV Person educated: Patient Education method: Explanation Education comprehension: verbalized understanding   GOALS: Goals reviewed with patient? Yes   LONG TERM GOALS: Target date: 06/25/2022    Pt will have a (-) Lt Dix-Hallpike test with no nystagmus and no c/o vertigo in test position.  Baseline:  Goal status: INITIAL  2.  Pt will be independent with habituation exercises and/or Epley maneuver for self treatment of BPPV prn.  Baseline:  Goal status: INITIAL  3.  Pt will report no vertigo with any bed mobility, ambulation or transitional movements.  Baseline:  Goal status: INITIAL  ASSESSMENT:  CLINICAL IMPRESSION: Patient is a 61 y.o. female who was seen today for physical therapy evaluation and treatment for Lt BPPV. No nystagmus was noted with any positional testing but pt reported spinning vertigo in Lt Dix-Hallpike test position.  Will continue to assess and treat as indicated.    OBJECTIVE IMPAIRMENTS decreased balance and difficulty walking.   ACTIVITY LIMITATIONS bending, bed mobility, and locomotion level  PARTICIPATION LIMITATIONS: meal  prep, cleaning, shopping, and playing pickle ball  PERSONAL FACTORS Behavior pattern are also affecting patient's functional outcome.   REHAB POTENTIAL: Good  CLINICAL DECISION MAKING: Stable/uncomplicated  EVALUATION COMPLEXITY: Low   PLAN: PT FREQUENCY: 2x/week  PT DURATION: 4 weeks  PLANNED INTERVENTIONS: Therapeutic exercises, Therapeutic activity, Neuromuscular re-education, Balance training, Gait training, Patient/Family education, Self Care, Vestibular training, and Canalith repositioning  PLAN FOR NEXT SESSION: recheck Lt BPPV   King Pinzon, Donavan Burnet, PT 05/28/2022, 8:28 PM

## 2022-05-29 ENCOUNTER — Ambulatory Visit (HOSPITAL_BASED_OUTPATIENT_CLINIC_OR_DEPARTMENT_OTHER)
Admission: RE | Admit: 2022-05-29 | Discharge: 2022-05-29 | Disposition: A | Discharge status: Home / Self Care | Payer: BC Managed Care – PPO | Source: Ambulatory Visit | Attending: Internal Medicine | Admitting: Internal Medicine

## 2022-05-29 ENCOUNTER — Ambulatory Visit (HOSPITAL_BASED_OUTPATIENT_CLINIC_OR_DEPARTMENT_OTHER): Payer: BC Managed Care – PPO

## 2022-05-29 ENCOUNTER — Ambulatory Visit: Payer: BC Managed Care – PPO | Admitting: Physical Therapy

## 2022-05-29 ENCOUNTER — Encounter (HOSPITAL_BASED_OUTPATIENT_CLINIC_OR_DEPARTMENT_OTHER): Payer: Self-pay

## 2022-05-29 DIAGNOSIS — H8112 Benign paroxysmal vertigo, left ear: Secondary | ICD-10-CM | POA: Diagnosis not present

## 2022-05-29 DIAGNOSIS — Z1231 Encounter for screening mammogram for malignant neoplasm of breast: Secondary | ICD-10-CM | POA: Diagnosis present

## 2022-05-29 NOTE — Patient Instructions (Signed)
Sit to Side-Lying    Sit on edge of bed. 1. Turn head 45 to right. 2. Maintain head position and lie down slowly on left side. Hold until symptoms subside. 3. Sit up slowly. Hold until symptoms subside. 4. Turn head 45 to left. 5. Maintain head position and lie down slowly on right side. Hold until symptoms subside. 6. Sit up slowly. Repeat sequence __5__ times per session. Do __3__ sessions per day.  Hold 15-20 secs each position:

## 2022-05-30 ENCOUNTER — Encounter: Payer: Self-pay | Admitting: Physical Therapy

## 2022-05-30 NOTE — Therapy (Signed)
OUTPATIENT PHYSICAL THERAPY VESTIBULAR TREATMENT NOTE    Patient Name: Mariah [name] MRN: [id number] DOB:January 28, 1961, 61 y.o., female Today's Date: 05/30/2022  PCP: Cheron Schaumann., MD  REFERRING PROVIDER: Jerene Bears, MD    PT End of Session - 05/30/22 1623     Visit Number 2    Number of Visits 4    Date for PT Re-Evaluation 06/22/22    Authorization Type BCBS    PT Start Time 1535    PT Stop Time 1617    PT Time Calculation (min) 42 min    Activity Tolerance Patient tolerated treatment well    Behavior During Therapy WFL for tasks assessed/performed             Past Medical History:  Diagnosis Date   Anxiety    Osteopenia    Vision abnormalities    Past Surgical History:  Procedure Laterality Date   DILATION AND CURETTAGE OF UTERUS     Patient Active Problem List   Diagnosis Date Noted   Bursitis of both hips 08/18/2021   Chronic insomnia 08/19/2018   Generalized anxiety disorder 07/14/2018   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   Sciatica of left side without back pain 02/09/2015   Agoraphobia with panic attacks 07/05/2014   Cervical pain 06/30/2014   Essential (primary) hypertension 06/30/2014   Disturbance of skin sensation 06/28/2014   Anxiety 05/05/2014   Benign essential hematuria 05/05/2014   Hot flash, menopausal 05/05/2014    ONSET DATE: 05-18-22  REFERRING DIAG: R42 (ICD-10-CM) - Vertigo   THERAPY DIAG:  BPPV (benign paroxysmal positional vertigo), left  Rationale for Evaluation and Treatment Rehabilitation  SUBJECTIVE:   SUBJECTIVE STATEMENT: Pt states she still doesn't feel right in her head; walked with husband this morning 3 miles; states she didn't have a lot of spinning vertigo when she got out of bed this morning, but states she feels that things are about the same as they were yesterday after eval Pt accompanied by: self  PERTINENT HISTORY: anxiety, h/o BPPV July 2022   PAIN:  Are you having pain? No  PRECAUTIONS:  None  WEIGHT BEARING RESTRICTIONS No  FALLS: Has patient fallen in last 6 months? No   PLOF: Independent  PATIENT GOALS resolve the vertigo  OBJECTIVE:      POSITIONAL TESTING: Right Dix-Hallpike: no nystagmus Left Dix-Hallpike: no nystagmus noted but pt reported dizziness with delayed onset  - same as at initial eval on 05-28-22     VESTIBULAR TREATMENT:  Canalith Repositioning:  Epley maneuver for Lt BPPV based on pt's subjective report of vertigo in Lt Dix-Hallpike test position 3 reps;  no nystagmus noted in any of the 3 reps; pt reported less dizziness with Rt sidelying to sitting position on 3rd rep  MCTSIB:  pt able to stand for 30 secs on all 4 conditions with no c/o dizziness and no significant postural sway  Pt performed x1 viewing exercise - standing - 60 secs 1 rep horizontal;  60 secs 1 rep vertical direction with no c/o dizziness  Habituation - 5 reps sit to Lt sidelying with 15 sec hold between transitional movements      PATIENT EDUCATION: Education details: 05-29-22;  issued HEP sit to Lt sidelying 5 reps 3 times/day  Person educated: Patient Education method: Explanation verbalization and handouts Education comprehension: verbalized understanding, demonstrated understanding   GOALS: Goals reviewed with patient? Yes   LONG TERM GOALS: Target date: 06/25/2022    Pt will have a (-) Lt Dix-Hallpike  test with no nystagmus and no c/o vertigo in test position.  Baseline:  Goal status: INITIAL  2.  Pt will be independent with habituation exercises and/or Epley maneuver for self treatment of BPPV prn.  Baseline:  Goal status: INITIAL  3.  Pt will report no vertigo with any bed mobility, ambulation or transitional movements.  Baseline:  Goal status: INITIAL  ASSESSMENT:  CLINICAL IMPRESSION: Pt continues to have no nystagmus with any positional testing but reports mild dizziness in Lt Dix-hallpike test position and reports mod dizziness with return to  upright from Lt sidelying position.  Intensity of dizziness decreased on reps 4 and 5 of habituation ex. Sit to Lt sidelying for HEP.  No LOB and no c/o dizziness on any of 4 conditions of MCTSIB, indicative of normal vestibular input in maintaining balance.  Will continue to assess vertigo prn.    OBJECTIVE IMPAIRMENTS decreased balance and difficulty walking.   ACTIVITY LIMITATIONS bending, bed mobility, and locomotion level  PARTICIPATION LIMITATIONS: meal prep, cleaning, shopping, and playing pickle ball  PERSONAL FACTORS Behavior pattern are also affecting patient's functional outcome.   REHAB POTENTIAL: Good  CLINICAL DECISION MAKING: Stable/uncomplicated  EVALUATION COMPLEXITY: Low   PLAN: PT FREQUENCY: 2x/week  PT DURATION: 4 weeks  PLANNED INTERVENTIONS: Therapeutic exercises, Therapeutic activity, Neuromuscular re-education, Balance training, Gait training, Patient/Family education, Self Care, Vestibular training, and Canalith repositioning  PLAN FOR NEXT SESSION: recheck Lt BPPV - habituation exercise   Vianne Grieshop, Donavan Burnet, PT 05/30/2022, 4:26 PM

## 2022-05-31 ENCOUNTER — Ambulatory Visit: Payer: BC Managed Care – PPO | Admitting: Physical Therapy

## 2022-06-12 ENCOUNTER — Ambulatory Visit: Payer: BC Managed Care – PPO | Admitting: Physical Therapy

## 2022-06-14 ENCOUNTER — Ambulatory Visit: Payer: BC Managed Care – PPO | Admitting: Psychiatry

## 2022-07-23 ENCOUNTER — Ambulatory Visit: Payer: BC Managed Care – PPO | Admitting: Psychiatry

## 2022-07-23 DIAGNOSIS — F411 Generalized anxiety disorder: Secondary | ICD-10-CM

## 2022-07-23 NOTE — Progress Notes (Signed)
[facility] Counselor/Therapist Progress Note  Patient ID: Mariah [name], MRN: [id number],    Date: 07/23/2022  Time Spent: 50 minutes start time 11:03 AM end time 11:53 PM  Treatment Type: Individual Therapy  Reported Symptoms: anxiety, muscle tension, chronic pain, health issues, sadness, obsessive thinking  Mental Status Exam:  Appearance:   Well Groomed     Behavior:  Appropriate  Motor:  Normal  Speech/Language:   Normal Rate  Affect:  Appropriate  Mood:  anxious  Thought process:  normal  Thought content:    WNL  Sensory/Perceptual disturbances:    WNL  Orientation:  oriented to person, place, time/date, and situation  Attention:  Good  Concentration:  Good  Memory:  WNL  Fund of knowledge:   Good  Insight:    Good  Judgment:   Good  Impulse Control:  Good   Risk Assessment: Danger to Self:  No Self-injurious Behavior: No Danger to Others: No Duty to Warn:no Physical Aggression / Violence:No  Access to Firearms a concern: No  Gang Involvement:No   Subjective: Patient was present for session. She shared that her anxiety has been up and down.  She stated her sleep is some better.  She stated that she got a massage and she was told she has to get back into yoga due to the muscle tension from her stress level. She went on to share that she had vertigo and she is still not over it. Discussed options and importance ofShe has had a few trips and on 1 of the trips her daughter called and the boyfriend had broken up with her. Her daughter got a dog and that is stressing her as well. She was able to recognize she can't take on her daughter's issues but it does create anxiety for her. She and husband did get away for a week with her husband and it was good due to his mother not calling. She [place] trip was amazing but there was lots of travel which was hard on her body as well. She went on to share that her mother in law is having medical issues and they are preparing for her  to die. She shared her husband and to make the decision to let her go and that has been hard.  Discussed how to support him through the process.  Patient also discussed the possibility of trying [place] for her anxiety and sadness.  Discussed the fact that she is not responded well to medications in the past so it may be a viable option.  She was encouraged to go ahead and talk to them to see what she would need to do for to pursue that option.  Interventions: Solution-Oriented/Positive Psychology and Insight-Oriented  Diagnosis:   ICD-10-CM   1. Generalized anxiety disorder  F41.1       Plan: Patient is to use CBT and coping skills to decrease anxiety symptoms.  Patient is to look into trying TMS to help decrease anxiety and depression symptoms.  Patient is to continue focusing on the things that she can control fix and change.  Patient is to make sure she gets enough sunlight or light went from her lamp daily to help decrease anxiety.  Patient is to continue exercising and working on self-care to release negative emotions appropriately.  Patient is to work on setting up small trips for she and her husband to enjoy and work on connecting.  Patient is to take her supplements and medication appropriately to make  sure that she is able to sleep at least a couple nights a week.  Patient is to take medication as directed. Long-term goal: Enhance ability to handle effectively the full variety of life's anxieties Short-term goal: Identify the major life complex in the past and present the form the basis for present anxiety.  Verbalize an understanding of the role that fearful thinking plays in creating fears excessive worry and persistent anxiety  [name], [facility]

## 2022-09-12 ENCOUNTER — Ambulatory Visit: Payer: BC Managed Care – PPO | Admitting: Psychiatry

## 2022-09-12 DIAGNOSIS — F411 Generalized anxiety disorder: Secondary | ICD-10-CM | POA: Diagnosis not present

## 2022-09-12 NOTE — Progress Notes (Unsigned)
[facility] Counselor/Therapist Progress Note  Patient ID: Mariah [name], MRN: [id number],    Date: 09/12/2022  Time Spent: 50 minutes start time 1:03 PM end time 1:53 PM  Treatment Type: Individual Therapy  Reported Symptoms: anxiety, health issues, sadness, rumination  Mental Status Exam:  Appearance:   Well Groomed     Behavior:  Appropriate  Motor:  Normal  Speech/Language:   Normal Rate  Affect:  Appropriate  Mood:  anxious  Thought process:  normal  Thought content:    WNL  Sensory/Perceptual disturbances:    WNL  Orientation:  oriented to person, place, time/date, and situation  Attention:  Good  Concentration:  Good  Memory:  WNL  Fund of knowledge:   Good  Insight:    Good  Judgment:   Good  Impulse Control:  Good   Risk Assessment: Danger to Self:  No Self-injurious Behavior: No Danger to Others: No Duty to Warn:no Physical Aggression / Violence:No  Access to Firearms a concern: No  Gang Involvement:No   Subjective: Patient was present for session.  She is having health issues. Mother in law still stressful.  She is moving forward with things concerning her mother and sister.  She is going to be working towards guardianship of her sister after the new year.She shared she is having lots of anxiety with the Holidays. Her son will be coming on the 21st and not going back until New year.She went on to share that she has had some rumination over his girlfriend not going to be around them much and that is concerning to her. Encouraged her to just take things as they come and realize she can't do anything about it. She shared that her daughter is still struggling with her mental health.  Allowed patient time to think through how she wants to take care of herself while she is dealing with the different things within her children's lives.  Patient was encouraged to remind herself that her daughter has her dog and that is making things okay so she will be able to get to  the other side of things.  Patient was also encouraged to focus on what she can control fix and change and continue working on her spirituality.  Interventions: Solution-Oriented/Positive Psychology and Insight-Oriented  Diagnosis:   ICD-10-CM   1. Generalized anxiety disorder  F41.1       Plan:  Patient is to use CBT and coping skills to decrease anxiety symptoms.  Patient is to continue focusing on the things that she can control fix and change.  Patient is to make sure she gets enough sunlight or light went from her lamp daily to help decrease anxiety.  Patient is to continue exercising and working on self-care to release negative emotions appropriately.  Patient is to work on setting up small trips for she and her husband to enjoy and work on connecting.  Patient is to take her supplements and medication appropriately to make sure that she is able to sleep at least a couple nights a week.  Patient is to take medication as directed. Long-term goal: Enhance ability to handle effectively the full variety of life's anxieties Short-term goal: Identify the major life complex in the past and present the form the basis for present anxiety.  Verbalize an understanding of the role that fearful thinking plays in creating fears excessive worry and persistent anxiety  [name], [facility]

## 2022-10-17 ENCOUNTER — Ambulatory Visit: Payer: BC Managed Care – PPO | Admitting: Psychiatry

## 2022-10-17 DIAGNOSIS — F411 Generalized anxiety disorder: Secondary | ICD-10-CM

## 2022-10-17 NOTE — Progress Notes (Signed)
[facility] Counselor/Therapist Progress Note  Patient ID: Mariah [name], MRN: [id number],    Date: 10/17/2022  Time Spent: 51 minutes start time 11:05 AM end time 11:56 AM  Treatment Type: Individual Therapy  Reported Symptoms: anxiety, sadness, triggered responses, sleep issues, rumination, fatigue  Mental Status Exam:  Appearance:   Casual     Behavior:  Appropriate  Motor:  Normal  Speech/Language:   Normal Rate  Affect:  Appropriate and Tearful  Mood:  anxious and sad  Thought process:  normal  Thought content:    WNL  Sensory/Perceptual disturbances:    WNL  Orientation:  oriented to person, place, time/date, and situation  Attention:  Good  Concentration:  Good  Memory:  WNL  Fund of knowledge:   Good  Insight:    Good  Judgment:   Good  Impulse Control:  Good   Risk Assessment: Danger to Self:  No Self-injurious Behavior: No Danger to Others: No Duty to Warn:no Physical Aggression / Violence:No  Access to Firearms a concern: No  Gang Involvement:No   Subjective: Patient was present for session. She shared that things were hard with her children home for the Holidays. She shared her daughter had a break down and that was hard for her.  Patient went on to share she is having difficulty still with her daughters depression and she is trying all of her coping skills but nothing seems to be truly helping her.  Patient was encouraged to think back to what she feels is behind her daughter's mood issues.  She was also encouraged to see see if she felt she has forgiven herself or anything that happened in the past that she does not need to hang onto.  Patient stated she feels she is holding on to some of the things that happened during her daughter's childhood that she feels responsible.  Discussed those things and encouraged patient to recognize she was not the reason for the problem and she had done everything she could to make things be okay for her daughter.  Encouraged  patient to continue trying to focus on things that she can control fix and change.  Patient stated she did have 1 positive thing that she got to the ENT and he was able to help deal with the vertigo issues she was having and that is progress.  Patient is continuing to work with the ENT to continue being free of the vertigo problems.  Interventions: Cognitive Behavioral Therapy and Solution-Oriented/Positive Psychology  Diagnosis:   ICD-10-CM   1. Generalized anxiety disorder  F41.1       Plan: Patient is to use CBT and coping skills to decrease anxiety symptoms.  Patient is to continue focusing on the things that she can control fix and change.  Patient is to make sure she gets enough sunlight or light went from her lamp daily to help decrease anxiety.  Patient is to continue exercising and working on self-care to release negative emotions appropriately.  Patient is to work on setting up small trips for she and her husband to enjoy and work on connecting.  Patient is to take her supplements and medication appropriately to make sure that she is able to sleep at least a couple nights a week.  Patient is to take medication as directed. Long-term goal: Enhance ability to handle effectively the full variety of life's anxieties Short-term goal: Identify the major life complex in the past and present the form the basis for present  anxiety.  Verbalize an understanding of the role that fearful thinking plays in creating fears excessive worry and persistent anxiety  Lina Sayre, [facility]

## 2022-11-13 ENCOUNTER — Ambulatory Visit: Payer: BC Managed Care – PPO | Admitting: Psychiatry

## 2022-12-17 ENCOUNTER — Ambulatory Visit: Payer: BC Managed Care – PPO | Admitting: Psychiatry

## 2022-12-17 DIAGNOSIS — F411 Generalized anxiety disorder: Secondary | ICD-10-CM | POA: Diagnosis not present

## 2022-12-17 NOTE — Progress Notes (Signed)
[facility] Counselor/Therapist Progress Note  Patient ID: Mariah [name], MRN: [id number],    Date: 12/17/2022  Time Spent: 50 minutes start time 12:06 PM end time 12:56 PM  Treatment Type: Individual Therapy  Reported Symptoms: fatigue, sickness, sleep issues, anxiety, depression, rumination, tearful  Mental Status Exam:  Appearance:   Well Groomed     Behavior:  Appropriate  Motor:  Normal  Speech/Language:   Normal Rate  Affect:  Appropriate tearful  Mood:  sad  Thought process:  normal  Thought content:    WNL  Sensory/Perceptual disturbances:    WNL  Orientation:  oriented to person, place, time/date, and situation  Attention:  Good  Concentration:  Good  Memory:  WNL  Fund of knowledge:   Good  Insight:    Good  Judgment:   Good  Impulse Control:  Good   Risk Assessment: Danger to Self:  No Self-injurious Behavior: No Danger to Others: No Duty to Warn:no Physical Aggression / Violence:No  Access to Firearms a concern: No  Gang Involvement:No   Subjective: Patient was present for session. She shared that she was sick for a month after last session.  She went on to share that she had a friend pass with colon cancer.  Her husband found his mother dead on December 06, 2022.  He took her to the hospital the day before due to her screaming in pain. She shared that he has beat himself up over the situation. They were able to get through everything even though it was stressful.She went to have testing at the integrative medicine.She is working on taking the supplements to try and help her. She shared that her anxiety is high and she is not sure that the changes are helpful. She shared that [name] is coming and all she wants to do is go somewhere and sleep. She shared that her daughter is not good, went to a bridal shower for a friend and that triggered sadness for her.  Patient reported she just feels that so many of her issues are medical as well as stress related.  She shared that  she feels that her anxiety and sleep have been an issue since she had menopause and there has been talk about her going on a hormone pill.  Patient was encouraged to do what she felt would be helpful for her self and if going on hormones is the best option or trying to get back on antidepressant either method is acceptable.  Patient was also encouraged to check into [place] with [name].  She shared she was wanting to go in that direction and had a friend who had a negative experience.  She shared that she clinician has had patients have very positive experiences and do well with decreasing anxiety and sadness through that process.  Patient agreed that she would try and talk with her provider [name], PMHNP about it further but would go ahead and start making phone calls to see if it is a possibility for her.  Also discussed the possibility of doing an IOP program if she feels her emotions are to that point, patient did not feel that was an option at this time either.  Interventions: Solution-Oriented/Positive Psychology  Diagnosis:   ICD-10-CM   1. Generalized anxiety disorder  F41.1       Plan: Patient is to use CBT and coping skills to decrease anxiety symptoms.  Patient is going to look into the possibility of [place] or hormone therapy.  Patient is to continue focusing on the things that she can control fix and change.  Patient is to make sure she gets enough sunlight or light went from her lamp daily to help decrease anxiety.  Patient is to continue exercising and working on self-care to release negative emotions appropriately.  Patient is to work on setting up small trips for she and her husband to enjoy and work on connecting.  Patient is to take her supplements and medication appropriately to make sure that she is able to sleep at least a couple nights a week.  Patient is to take medication as directed. Long-term goal: Enhance ability to handle effectively the full variety of life's  anxieties Short-term goal: Identify the major life complex in the past and present the form the basis for present anxiety.  Verbalize an understanding of the role that fearful thinking plays in creating fears excessive worry and persistent anxiety  Lina Sayre, [facility]

## 2022-12-27 ENCOUNTER — Encounter: Payer: Self-pay | Admitting: Psychiatry

## 2022-12-27 ENCOUNTER — Ambulatory Visit: Payer: BC Managed Care – PPO | Admitting: Psychiatry

## 2022-12-27 DIAGNOSIS — F422 Mixed obsessional thoughts and acts: Secondary | ICD-10-CM | POA: Diagnosis not present

## 2022-12-27 DIAGNOSIS — F411 Generalized anxiety disorder: Secondary | ICD-10-CM | POA: Diagnosis not present

## 2022-12-27 DIAGNOSIS — F5101 Primary insomnia: Secondary | ICD-10-CM

## 2022-12-27 MED ORDER — CLONAZEPAM 0.5 MG PO TABS: ORAL_TABLET | ORAL | 0 refills | Status: DC

## 2022-12-27 NOTE — Progress Notes (Signed)
Mariah [name] [id number] 10-Jun-1961 62 y.o.  Subjective:   Patient ID:  Mariah [name] is a 62 y.o. (DOB 1961-01-11) female.  Chief Complaint:  Chief Complaint  Patient presents with   Sleeping Problem   Anxiety    HPI Mariah [name] presents to the office today for follow-up of anxiety and insomnia. She reports, "It's been a rough year." She reports that she was sick for 64-4 weeks with a respiratory illness for 3-4 weeks and this triggered depression with not being able to be active and get outside. Mother-in-law passed away in 12/28/22.   She reports, "I've always had problems with hurting when I am stressed out." Denies panic attacks. Reports Magnesium is helpful for her sleep and not for anxiety. She reports that sleep amount varies and slept 6 hours last night "which is a lot for me." She reports frequent worry and feeling nervous. She reports worrying, "when am I ever going to be back to being a healthy person?" She reports that her mood is low in response to anxiety. She reports that she has motivation and interest in doing things. She reports that she is more easily fatigued after physical activity. She reports weight gain in response to being less active. She reports poor concentration and focus. Denies SI.   She has a telephone interview with [facility]. She talked with friend who completed [place] and experienced some increased anxiety with second course of treatments.   She went on a safari [place] in October. She started with light therapy when she returned and tried to implement other coping strategies, to include healthy food intake and regular activity.   Has seen provider at [facility]. She reports that provider has recommended considering progesterone in the future. She was also advised to avoid flours that are not organic. Was given CBD/THC gummies and reports that this was helpful for her pain and not for anxiety.   She reports that Lorazepam is no longer  helpful for her sleep. She reports that Lorazepam is helpful for her anxiety and then about 3 days later it is higher than it would have been otherwise.   Has trip planned to [place] in May.   She reports worry about her 64 yo mother's health and anticipates needing to be a caregiver for mother and sister with special needs that her mother has cared for.   Past Psychiatric Medication Trials: Difficulty with starting SSRI's in the past (fatigue, difficulty with concentration, unable to function) Silenor Belsomra-side effects, minimal efficacy Ambien- Parasomnias Benadryl- excessive somnolence Melatonin- ineffective Lexapro- adverse effects Paxil- Multiple side effects Zoloft-adverse effects Viibryd Ativan- Effective, has grogginess that carried over the next day. Has taken 0.5 mg.  Gabapentin- Excessive somnolence Clonidine  PHQ2-9    [place] Office Visit from 02/19/2022 in [facility] Total Score 0        Review of Systems:  Review of Systems  Respiratory:  Negative for shortness of breath.   Cardiovascular:        Will feel heart is beating quickly at times and her HR will be in normal range, ie. In the 70's.  Musculoskeletal:  Positive for myalgias. Negative for arthralgias and gait problem.       Describes "hurting."  Psychiatric/Behavioral:         Please refer to HPI    Medications: I have reviewed the patient's current medications.  Current Outpatient Medications  Medication Sig Dispense Refill  Calcium Carb-Cholecalciferol (CALCIUM 600/VITAMIN D PO) Take by mouth.     clonazePAM (KLONOPIN) 0.5 MG tablet Take 1/2 tablet twice daily as needed for anxiety or insomnia 30 tablet 0   MAGNESIUM GLYCINATE PO Take by mouth.     Omega-3 Fatty Acids (FISH OIL PO) Take by mouth.     THEANINE PO Take by mouth.     COVID-19 mRNA Vac-TriS, Pfizer, (PFIZER-BIONT COVID-19 VAC-TRIS) SUSP injection Inject into the  muscle. 0.3 mL 0   TURMERIC PO Take by mouth.     No current facility-administered medications for this visit.    Medication Side Effects: Other: N/A  Allergies: No Known Allergies  Past Medical History:  Diagnosis Date   Anxiety    Osteopenia    Vision abnormalities     Past Medical History, Surgical history, Social history, and Family history were reviewed and updated as appropriate.   Please see review of systems for further details on the patient's review from today.   Objective:   Physical Exam:  LMP 07/14/2011   Physical Exam Constitutional:      General: She is not in acute distress. Musculoskeletal:        General: No deformity.  Neurological:     Mental Status: She is alert and oriented to person, place, and time.     Coordination: Coordination normal.  Psychiatric:        Attention and Perception: Attention and perception normal. She does not perceive auditory or visual hallucinations.        Mood and Affect: Mood normal. Mood is not anxious or depressed. Affect is flat. Affect is not labile, blunt, angry or inappropriate.        Speech: Speech normal.        Behavior: Behavior normal.        Thought Content: Thought content normal. Thought content is not paranoid or delusional. Thought content does not include homicidal or suicidal ideation. Thought content does not include homicidal or suicidal plan.        Cognition and Memory: Cognition and memory normal.        Judgment: Judgment normal.     Comments: Insight intact     Lab Review:     Component Value Date/Time   NA 141 06/28/2014 1618   K 4.0 06/28/2014 1618   CL 103 06/28/2014 1618   CO2 28 06/28/2014 1607   GLUCOSE 105 (H) 06/28/2014 1618   BUN 16 06/28/2014 1618   CREATININE 0.80 06/28/2014 1618   CALCIUM 9.5 06/28/2014 1607   GFRNONAA >90 06/28/2014 1607   GFRAA >90 06/28/2014 1607       Component Value Date/Time   WBC 8.1 06/28/2014 1607   RBC 4.27 06/28/2014 1607   HGB 13.9  06/28/2014 1618   HCT 41.0 06/28/2014 1618   PLT 227 06/28/2014 1607   MCV 87.8 06/28/2014 1607   MCH 30.2 06/28/2014 1607   MCHC 34.4 06/28/2014 1607   RDW 12.9 06/28/2014 1607   LYMPHSABS 1.7 06/28/2014 1607   MONOABS 0.6 06/28/2014 1607   EOSABS 0.1 06/28/2014 1607   BASOSABS 0.0 06/28/2014 1607    No results found for: "POCLITH", "LITHIUM"   No results found for: "PHENYTOIN", "PHENOBARB", "VALPROATE", "CBMZ"   .res Assessment: Plan:    Pt seen for 45 minutes and time spent discussing anxiety symptoms and changes to her social history since last visit on 04/10/22. Discussed potential benefits, risks, and side effects of treatment options to include Lorazepam XR, Klonopin, or  Cymbalta. Discussed that lower doses of Cymbalta are available through compounding pharmacy. Discussed her concerns about possible increased risk of dementia with benzodiazepines. Pt agrees to trial of Klonopin. Will start Klonopin 0.5 mg 1/2 tablets twice daily as needed for anxiety and insomnia.  Recommended continuing to consider [place] as possible treatment option since she reports limited improvement and adverse effects with multiple medications.   Recommend continuing therapy with Lina Sayre, [facility].  Patient advised to contact office with any questions, adverse effects, or acute worsening in signs and symptoms. Pt to follow-up with this provider in 3-4 weeks or sooner if clinically indicated.   L[place]isha was seen today for sleeping problem and anxiety.  Diagnoses and all orders for this visit:  Generalized anxiety disorder -     clonazePAM (KLONOPIN) 0.5 MG tablet; Take 1/2 tablet twice daily as needed for anxiety or insomnia  Mixed obsessional thoughts and acts  Primary insomnia     Please see After Visit Summary for patient specific instructions.  Future Appointments  Date Time Provider [place]  01/17/2023  1:00 PM [name], PMHNP CP-CP None  03/18/2023  1:15 PM Megan Salon, MD  [facility]  04/24/2023  9:00 AM Buford Dresser, MD [facility]    No orders of the defined types were placed in this encounter.   -------------------------------

## 2023-01-17 ENCOUNTER — Ambulatory Visit: Payer: BC Managed Care – PPO | Admitting: Psychiatry

## 2023-02-05 ENCOUNTER — Ambulatory Visit: Payer: BC Managed Care – PPO | Admitting: Psychiatry

## 2023-02-05 ENCOUNTER — Encounter: Payer: Self-pay | Admitting: Psychiatry

## 2023-02-05 DIAGNOSIS — F422 Mixed obsessional thoughts and acts: Secondary | ICD-10-CM | POA: Diagnosis not present

## 2023-02-05 DIAGNOSIS — F411 Generalized anxiety disorder: Secondary | ICD-10-CM

## 2023-02-05 DIAGNOSIS — F5101 Primary insomnia: Secondary | ICD-10-CM

## 2023-02-05 MED ORDER — LORAZEPAM 0.5 MG PO TABS: ORAL_TABLET | ORAL | 1 refills | Status: DC

## 2023-02-05 NOTE — Progress Notes (Signed)
Mariah [name] [id number] Dec 19, 1960 62 y.o.  Subjective:   Patient ID:  Mariah [name] is a 62 y.o. (DOB 1961-09-16) female.  Chief Complaint:  Chief Complaint  Patient presents with   Follow-up    Anxiety, insomnia    HPI [name] presents to the office today for follow-up of anxiety and insomnia. She reports that her anxiety is "better, but I couldn't take that medicine," referring to Klonopin. She reports that Lorazepam seems to be the only medicine that seems to be helpful for her anxiety. She reports that L-Theanine XR has helped her anxiety at night and during the day. She plans to also try L-Theanine immediate release. She reports that she notices she feels best when she takes L-Theanine and Magnesium. She reports that she is occasionally getting 6 hours of interrupted sleep. She reports that she has been able to return to sleep more easily. She reports that she is "feeling a little bit better, but not what I used to be." Energy and motivation have improved and she has been training for her hiking trip. She reports that her appetite has been good. She has made some dietary changes that seem to help with her anxiety. She reports that her concentration has been good after she has slept. Denies SI.   She reports that she is tired today because she needed to sleep more, "but I am not anxious." Denies any recent panic attacks.   She is flying to [place] and then renting a car for a road trip. She reports that husband has sleep apnea and they typically sleep in separate rooms and they will be sleeping in the same room.   She reports that her mother is not in good health and "what to come has got me down... if I am going to have anxiety and my mental health, it is related to her and what I will have to deal with." Sister with special needs would have to go to an ALF if/when something happens to her mother. Mother continues to drive. Pt goes with mother to her medical appointments since mother is  starting to have some memory issues.   Past Psychiatric Medication Trials: Difficulty with starting SSRI's in the past (fatigue, difficulty with concentration, unable to function) Silenor Belsomra-side effects, minimal efficacy Ambien- Parasomnias Benadryl- excessive somnolence Melatonin- ineffective Lexapro- adverse effects Paxil- Multiple side effects Zoloft-adverse effects Viibryd Ativan- Effective, has grogginess that carried over the next day. Has taken 0.5 mg.  Klonopin- "couldn't take it" Gabapentin- Excessive somnolence Clonidine     PHQ2-9    Flowsheet Row Office Visit from 02/19/2022 in [facility] Total Score 0        Review of Systems:  Review of Systems  Constitutional:  Negative for fatigue.  Musculoskeletal:  Negative for gait problem.  Psychiatric/Behavioral:         Please refer to HPI    Medications: I have reviewed the patient's current medications.  Current Outpatient Medications  Medication Sig Dispense Refill   Calcium Carb-Cholecalciferol (CALCIUM 600/VITAMIN D PO) Take by mouth.     LORazepam (ATIVAN) 0.5 MG tablet Take 1/2-1 tab po every 6 hours as needed for anxiety/panic 60 tablet 1   MAGNESIUM GLYCINATE PO Take by mouth.     Omega-3 Fatty Acids (FISH OIL PO) Take by mouth.     THEANINE PO Take by mouth.     TURMERIC PO Take by mouth.     VITAMIN D-VITAMIN  K PO Take by mouth.     COVID-19 mRNA Vac-TriS, Pfizer, (PFIZER-BIONT COVID-19 VAC-TRIS) SUSP injection Inject into the muscle. 0.3 mL 0   No current facility-administered medications for this visit.    Medication Side Effects: Other: N/A  Allergies: No Known Allergies  Past Medical History:  Diagnosis Date   Anxiety    Osteopenia    Vision abnormalities     Past Medical History, Surgical history, Social history, and Family history were reviewed and updated as appropriate.   Please see review of systems for further  details on the patient's review from today.   Objective:   Physical Exam:  LMP 07/14/2011   Physical Exam Constitutional:      General: She is not in acute distress. Musculoskeletal:        General: No deformity.  Neurological:     Mental Status: She is alert and oriented to person, place, and time.     Coordination: Coordination normal.  Psychiatric:        Attention and Perception: Attention and perception normal. She does not perceive auditory or visual hallucinations.        Mood and Affect: Mood is not depressed. Affect is not labile, blunt, angry or inappropriate.        Speech: Speech normal.        Behavior: Behavior normal.        Thought Content: Thought content normal. Thought content is not paranoid or delusional. Thought content does not include homicidal or suicidal ideation. Thought content does not include homicidal or suicidal plan.        Cognition and Memory: Cognition and memory normal.        Judgment: Judgment normal.     Comments: Insight intact Mood is less anxious      Lab Review:     Component Value Date/Time   NA 141 06/28/2014 1618   K 4.0 06/28/2014 1618   CL 103 06/28/2014 1618   CO2 28 06/28/2014 1607   GLUCOSE 105 (H) 06/28/2014 1618   BUN 16 06/28/2014 1618   CREATININE 0.80 06/28/2014 1618   CALCIUM 9.5 06/28/2014 1607   GFRNONAA >90 06/28/2014 1607   GFRAA >90 06/28/2014 1607       Component Value Date/Time   WBC 8.1 06/28/2014 1607   RBC 4.27 06/28/2014 1607   HGB 13.9 06/28/2014 1618   HCT 41.0 06/28/2014 1618   PLT 227 06/28/2014 1607   MCV 87.8 06/28/2014 1607   MCH 30.2 06/28/2014 1607   MCHC 34.4 06/28/2014 1607   RDW 12.9 06/28/2014 1607   LYMPHSABS 1.7 06/28/2014 1607   MONOABS 0.6 06/28/2014 1607   EOSABS 0.1 06/28/2014 1607   BASOSABS 0.0 06/28/2014 1607    No results found for: "POCLITH", "LITHIUM"   No results found for: "PHENYTOIN", "PHENOBARB", "VALPROATE", "CBMZ"   .res Assessment: Plan:    Discussed  response to Klonopin and supplements recommended by holistic practitioner. Pt reports that she stopped Klonopin due to adverse effects. She reports that Lorazepam prn has been the only medication that has been somewhat helpful for her anxiety and insomnia. She reports that she prefers not to take it regularly, and only as needed.  Will re-start Lorazepam 0.5 mg 1/2-1 tab po q 6 hours prn anxiety, panic, or insomnia.  Recommend continuing therapy with [name], [facility].  Pt to follow-up as needed.   Pietra was seen today for follow-up.  Diagnoses and all orders for this visit:  Generalized anxiety disorder -  LORazepam (ATIVAN) 0.5 MG tablet; Take 1/2-1 tab po every 6 hours as needed for anxiety/panic  Mixed obsessional thoughts and acts -     LORazepam (ATIVAN) 0.5 MG tablet; Take 1/2-1 tab po every 6 hours as needed for anxiety/panic  Primary insomnia -     LORazepam (ATIVAN) 0.5 MG tablet; Take 1/2-1 tab po every 6 hours as needed for anxiety/panic     Please see After Visit Summary for patient specific instructions.  Future Appointments  Date Time Provider Department Center  03/18/2023  1:15 PM Jerene Bears, MD [facility]  04/24/2023  9:00 AM Jodelle Red, MD [facility]    No orders of the defined types were placed in this encounter.   -------------------------------

## 2023-03-18 ENCOUNTER — Other Ambulatory Visit (HOSPITAL_COMMUNITY)
Admission: RE | Admit: 2023-03-18 | Discharge: 2023-03-18 | Disposition: A | Discharge status: Home / Self Care | Payer: BC Managed Care – PPO | Source: Ambulatory Visit | Attending: Obstetrics & Gynecology | Admitting: Obstetrics & Gynecology

## 2023-03-18 ENCOUNTER — Ambulatory Visit (INDEPENDENT_AMBULATORY_CARE_PROVIDER_SITE_OTHER): Payer: BC Managed Care – PPO | Admitting: Obstetrics & Gynecology

## 2023-03-18 ENCOUNTER — Encounter (HOSPITAL_BASED_OUTPATIENT_CLINIC_OR_DEPARTMENT_OTHER): Payer: Self-pay | Admitting: Obstetrics & Gynecology

## 2023-03-18 VITALS — BP 136/81 | HR 77 | Ht 64.5 in | Wt 125.2 lb

## 2023-03-18 DIAGNOSIS — Z01419 Encounter for gynecological examination (general) (routine) without abnormal findings: Secondary | ICD-10-CM | POA: Diagnosis not present

## 2023-03-18 DIAGNOSIS — N9489 Other specified conditions associated with female genital organs and menstrual cycle: Secondary | ICD-10-CM

## 2023-03-18 DIAGNOSIS — Z124 Encounter for screening for malignant neoplasm of cervix: Secondary | ICD-10-CM

## 2023-03-18 DIAGNOSIS — Z13820 Encounter for screening for osteoporosis: Secondary | ICD-10-CM | POA: Diagnosis not present

## 2023-03-18 DIAGNOSIS — B999 Unspecified infectious disease: Secondary | ICD-10-CM

## 2023-03-18 DIAGNOSIS — F5101 Primary insomnia: Secondary | ICD-10-CM | POA: Diagnosis not present

## 2023-03-18 DIAGNOSIS — Z780 Asymptomatic menopausal state: Secondary | ICD-10-CM

## 2023-03-18 MED ORDER — PROGESTERONE MICRONIZED 100 MG PO CAPS: 100.0000 mg | ORAL_CAPSULE | Freq: Every day | ORAL | 12 refills | Status: DC

## 2023-03-18 NOTE — Progress Notes (Signed)
62 y.o. G77P2002 Married White or Caucasian female here for annual exam.  Reports two additional issues with respiratory infections this year.  When this happens, she is just whipped and feels exhausted.  Really likes to hike but doesn't seem like she can walk/exercise like she wants.  Does have anxiety and has tried multiple medications and didn't tolerated them.  She does not think her fatigue is related to this.   She has seen cardiology, Dr. Cristal Deer, last year due to palpitations.  Evaluation did not show any significant findings.  She   She did go to integrative medicine last year.  Has lab work for me to review today.  Also frustrated with sleep.  She is interested in trying progesterone for sleep.    Patient's last menstrual period was 07/14/2011.          Sexually active: No.  The current method of family planning is post menopausal status.    Smoker:  no  Health Maintenance: Pap:  10/13/2019 Negative History of abnormal Pap:  no MMG:  05/29/2022 Negative Colonoscopy:  03/07/2021 BMD:   03/16/2020 Osteoporosis Screening Labs: 06/2022   reports that she quit smoking about 39 years ago. Her smoking use included cigarettes. She has never used smokeless tobacco. She reports that she does not drink alcohol and does not use drugs.  Past Medical History:  Diagnosis Date   Anxiety    Osteopenia    Vision abnormalities     Past Surgical History:  Procedure Laterality Date   DILATION AND CURETTAGE OF UTERUS      Current Outpatient Medications  Medication Sig Dispense Refill   Calcium Carb-Cholecalciferol (CALCIUM 600/VITAMIN D PO) Take by mouth.     COVID-19 mRNA Vac-TriS, Pfizer, (PFIZER-BIONT COVID-19 VAC-TRIS) SUSP injection Inject into the muscle. 0.3 mL 0   LORazepam (ATIVAN) 0.5 MG tablet Take 1/2-1 tab po every 6 hours as needed for anxiety/panic 60 tablet 1   MAGNESIUM GLYCINATE PO Take by mouth.     THEANINE PO Take by mouth.     VITAMIN D-VITAMIN K PO Take by  mouth.     No current facility-administered medications for this visit.    Family History  Problem Relation Age of Onset   Hypertension Mother    Skin cancer Mother    Melanoma Father    Multiple myeloma Father    Bipolar disorder Sister    Heart disease Maternal Grandmother    Heart disease Paternal Grandmother    Anxiety disorder Daughter    Depression Son     ROS: Constitutional: negative Genitourinary:negative  Exam:   BP 136/81 (BP Location: Right Arm, Patient Position: Sitting, Cuff Size: Normal)   Pulse 77   Ht 5' 4.5" (1.638 m) Comment: Reported  Wt 125 lb 3.2 oz (56.8 kg)   LMP 07/14/2011   BMI 21.16 kg/m   Height: 5' 4.5" (163.8 cm) (Reported)  General appearance: alert, cooperative and appears stated age Head: Normocephalic, without obvious abnormality, atraumatic Neck: no adenopathy, supple, symmetrical, trachea midline and thyroid normal to inspection and palpation Lungs: clear to auscultation bilaterally Breasts: normal appearance, no masses or tenderness Heart: regular rate and rhythm Abdomen: soft, non-tender; bowel sounds normal; no masses,  no organomegaly Extremities: extremities normal, atraumatic, no cyanosis or edema Skin: Skin color, texture, turgor normal. No rashes or lesions Lymph nodes: Cervical, supraclavicular, and axillary nodes normal. No abnormal inguinal nodes palpated Neurologic: Grossly normal   Pelvic: External genitalia:  no lesions  Urethra:  normal appearing urethra with no masses, tenderness or lesions              Bartholins and Skenes: normal                 Vagina: normal appearing vagina with normal color and no discharge, no lesions              Cervix: no lesions              Pap taken: Yes.   Bimanual Exam:  Uterus:  normal size, contour, position, consistency, mobility, non-tender              Adnexa: possible soft adnexal mass               Rectovaginal: Confirms               Anus:  normal sphincter  tone, no lesions  Chaperone, Ina Homes, CMA, was present for exam.  Assessment/Plan: 1. Well woman exam with routine gynecological exam - Pap smear obtained today - Mammogram 05/2022 - Colonoscopy 2022, follow up 10 years - Bone mineral density ordered - lab work has been completed - vaccines reviewed/updated  2. Osteoporosis screening - DG BONE DENSITY (DXA); Future  3. Recurrent infections - Ambulatory referral to Pulmonology  4. Primary insomnia - progesterone (PROMETRIUM) 100 MG capsule; Take 1 capsule (100 mg total) by mouth daily.  Dispense: 30 capsule; Refill: 12  5. Cervical cancer screening - Cytology - PAP( [place])  6. Postmenopausal  7. Adnexal mass - will return for pelvic ultrasound

## 2023-03-20 ENCOUNTER — Ambulatory Visit (INDEPENDENT_AMBULATORY_CARE_PROVIDER_SITE_OTHER): Payer: BC Managed Care – PPO

## 2023-03-20 ENCOUNTER — Encounter (HOSPITAL_BASED_OUTPATIENT_CLINIC_OR_DEPARTMENT_OTHER): Payer: Self-pay | Admitting: Obstetrics & Gynecology

## 2023-03-20 ENCOUNTER — Ambulatory Visit (HOSPITAL_BASED_OUTPATIENT_CLINIC_OR_DEPARTMENT_OTHER): Payer: BC Managed Care – PPO | Admitting: Obstetrics & Gynecology

## 2023-03-20 VITALS — BP 134/72 | HR 74 | Ht 64.5 in | Wt 125.4 lb

## 2023-03-20 DIAGNOSIS — N9489 Other specified conditions associated with female genital organs and menstrual cycle: Secondary | ICD-10-CM

## 2023-03-20 LAB — CYTOLOGY - PAP
Comment: NEGATIVE
Diagnosis: NEGATIVE
High risk HPV: NEGATIVE

## 2023-03-27 NOTE — Progress Notes (Signed)
GYNECOLOGY  VISIT  CC:   follow up after ultrasound  HPI: 62 y.o. G59P2002 Married White or Caucasian female here to discuss ultrasound.  Possible soft adnexal mass noted vs bowel/stool noted on exam on 03/18/2023.  Pt has no pain.  Ultrasound showed uterus 5.6 x 3.0 x 2.0cm with 2.51mm endometrium.  Ovaries on the left clearly atrophic.  Right ovary not clearly seen due to bowel but no mass/cyst noted.  Findings reassuring and reviewed with pt.  She does have followed up with Dr. Cristal Deer. Proceeding with additional cardiac evaluation reviewed.  Pulmonology referral placed when I saw pt for gyn exam 6/17.  She is scheduled with Dr. Vassie Loll as well in July.    Past Medical History:  Diagnosis Date   Anxiety    Osteopenia    Vision abnormalities     MEDS:   Current Outpatient Medications on File Prior to Visit  Medication Sig Dispense Refill   Calcium Carb-Cholecalciferol (CALCIUM 600/VITAMIN D PO) Take by mouth.     COVID-19 mRNA Vac-TriS, Pfizer, (PFIZER-BIONT COVID-19 VAC-TRIS) SUSP injection Inject into the muscle. 0.3 mL 0   LORazepam (ATIVAN) 0.5 MG tablet Take 1/2-1 tab po every 6 hours as needed for anxiety/panic 60 tablet 1   MAGNESIUM GLYCINATE PO Take by mouth.     progesterone (PROMETRIUM) 100 MG capsule Take 1 capsule (100 mg total) by mouth daily. 30 capsule 12   THEANINE PO Take by mouth.     VITAMIN D-VITAMIN K PO Take by mouth.     No current facility-administered medications on file prior to visit.    ALLERGIES: Patient has no known allergies.  SH:  married, non smoker  Review of Systems  Constitutional: Negative.   Genitourinary: Negative.     PHYSICAL EXAMINATION:    BP 134/72 (BP Location: Right Arm, Patient Position: Sitting, Cuff Size: Normal)   Pulse 74   Ht 5' 4.5" (1.638 m) Comment: Reported  Wt 125 lb 6.4 oz (56.9 kg)   LMP 07/14/2011   BMI 21.19 kg/m     Physical Exam Constitutional:      Appearance: Normal appearance.  Neurological:      General: No focal deficit present.     Mental Status: She is alert.  Psychiatric:        Mood and Affect: Mood normal.        Behavior: Behavior normal.      Assessment/Plan: 1. Adnexal mass - pt reassured by findings.  Knows to call with any new concerns.

## 2023-03-28 ENCOUNTER — Ambulatory Visit (HOSPITAL_BASED_OUTPATIENT_CLINIC_OR_DEPARTMENT_OTHER)
Admission: RE | Admit: 2023-03-28 | Discharge: 2023-03-28 | Disposition: A | Discharge status: Home / Self Care | Payer: BC Managed Care – PPO | Source: Ambulatory Visit | Attending: Obstetrics & Gynecology | Admitting: Obstetrics & Gynecology

## 2023-03-28 ENCOUNTER — Other Ambulatory Visit (HOSPITAL_BASED_OUTPATIENT_CLINIC_OR_DEPARTMENT_OTHER): Payer: Self-pay | Admitting: Internal Medicine

## 2023-03-28 DIAGNOSIS — Z13820 Encounter for screening for osteoporosis: Secondary | ICD-10-CM | POA: Insufficient documentation

## 2023-03-28 DIAGNOSIS — Z1231 Encounter for screening mammogram for malignant neoplasm of breast: Secondary | ICD-10-CM

## 2023-04-24 ENCOUNTER — Ambulatory Visit (HOSPITAL_BASED_OUTPATIENT_CLINIC_OR_DEPARTMENT_OTHER): Payer: BC Managed Care – PPO | Admitting: Cardiology

## 2023-04-24 ENCOUNTER — Encounter (HOSPITAL_BASED_OUTPATIENT_CLINIC_OR_DEPARTMENT_OTHER): Payer: Self-pay | Admitting: Cardiology

## 2023-04-24 VITALS — BP 138/72 | HR 73 | Ht 65.0 in | Wt 128.0 lb

## 2023-04-24 DIAGNOSIS — Z7189 Other specified counseling: Secondary | ICD-10-CM

## 2023-04-24 DIAGNOSIS — R002 Palpitations: Secondary | ICD-10-CM

## 2023-04-24 DIAGNOSIS — R0989 Other specified symptoms and signs involving the circulatory and respiratory systems: Secondary | ICD-10-CM

## 2023-04-24 DIAGNOSIS — R6889 Other general symptoms and signs: Secondary | ICD-10-CM

## 2023-04-24 NOTE — Progress Notes (Signed)
Cardiology Office Note:  .   Date:  04/24/2023  ID:  Mariah [name], DOB 06/17/1961, MRN [id number] PCP: Cheron Schaumann., MD  [facility] Providers Cardiologist:  Jodelle Red, MD {  History of Present Illness: .   Mariah [name] is a 62 y.o. female with PMH palpitations, initially seen 03/09/2022. She is here for follow up today.   Today:  Has had a tough year with her health. Has had issues with energy/stamina. Feels like the more she exercises, the worse she feels. She hurts all over. Has had two upper respiratory infections recently, and many of her overall symptoms occurred after a respiratory infection in 10/2021.   Has been working to get back into pickleball, but she feels very tired after this. In comparison, when she was 62 years old (5 years ago), she hiked 28 miles in one day. Now she does not have the stamina--no chest pain, not shortness of breath that limits her. Then after she is fatigued for a time, usually takes hours to a day to reset.  Of note, losing insurance in August, then has a gap before she goes on Medicare.  ROS: Denies chest pain, shortness of breath at rest or with normal exertion. No PND, orthopnea, LE edema or unexpected weight gain. No syncope. ROS otherwise negative except as noted.   Studies Reviewed: Marland Kitchen    EKG:  EKG Interpretation Date/Time:  Wednesday April 24 2023 09:11:59 EDT Ventricular Rate:  67 PR Interval:  166 QRS Duration:  78 QT Interval:  408 QTC Calculation: 431 R Axis:   61  Text Interpretation: Normal sinus rhythm Normal ECG Confirmed by Jodelle Red [phone]) on 04/24/2023 9:39:37 AM    Physical Exam:   VS:  BP 138/72   Pulse 73   Ht 5\' 5"  (1.651 m)   Wt 128 lb (58.1 kg)   LMP 07/14/2011   SpO2 99%   BMI 21.30 kg/m    Wt Readings from Last 3 Encounters:  04/24/23 128 lb (58.1 kg)  03/20/23 125 lb 6.4 oz (56.9 kg)  03/18/23 125 lb 3.2 oz (56.8 kg)    GEN: Well nourished, well developed in no acute  distress HEENT: Normal, moist mucous membranes NECK: No JVD CARDIAC: regular rhythm, normal S1 and S2, no rubs or gallops. No murmur. VASCULAR: Radial and DP pulses 2+ bilaterally. No carotid bruits RESPIRATORY:  Clear to auscultation without rales, wheezing or rhonchi  ABDOMEN: Soft, non-tender, non-distended MUSCULOSKELETAL:  Ambulates independently SKIN: Warm and dry, no edema NEUROLOGIC:  Alert and oriented x 3. No focal neuro deficits noted. PSYCHIATRIC:  Normal affect    ASSESSMENT AND PLAN: .   Palpitations -monitor 2023 without high risk findings, largely symptoms did not correlate with rhythm issues -ecg unremarkable today  Exercise intolerance -discussed both treadmill stress test and CPX today. She is pending evaluation with pulmonary as well. She will think about this and contact me.   Labile blood pressure -not on medication, not at treatment level today, continue to monitor  CV risk counseling and prevention -recommend heart healthy/Mediterranean diet, with whole grains, fruits, vegetable, fish, lean meats, nuts, and olive oil. Limit salt. -recommend moderate walking, 3-5 times/week for 30-50 minutes each session. Aim for at least 150 minutes.week. Goal should be pace of 3 miles/hours, or walking 1.5 miles in 30 minutes -recommend avoidance of tobacco products. Avoid excess alcohol. -ASCVD risk score: The 10-year ASCVD risk score (Arnett DK, et al., 2019) is: 4.4%   Values used to calculate  the score:     Age: 51 years     Sex: Female     Is Non-Hispanic African American: No     Diabetic: No     Tobacco smoker: No     Systolic Blood Pressure: 138 mmHg     Is BP treated: No     HDL Cholesterol: 100 MG/DL     Total Cholesterol: 204 MG/DL    Dispo: 1 year or sooner if needed  Signed, Jodelle Red, MD   Jodelle Red, MD, PhD, [facility] [place]  [facility]  [place]  [facility] & Vascular at [facility] at [facility] [address], [address] [place], [place] [address] [phone]

## 2023-04-24 NOTE — Patient Instructions (Signed)
Medication Instructions:  The current medical regimen is effective;  continue present plan and medications.  *If you need a refill on your cardiac medications before your next appointment, please call your pharmacy*   Lab Work: None  Testing/Procedures: None   Follow-Up: At [facility], you and your health needs are our priority.  As part of our continuing mission to provide you with exceptional heart care, we have created designated Provider Care Teams.  These Care Teams include your primary Cardiologist (physician) and Advanced Practice Providers (APPs -  Physician Assistants and Nurse Practitioners) who all work together to provide you with the care you need, when you need it.  We recommend signing up for the patient portal called "MyChart".  Sign up information is provided on this After Visit Summary.  MyChart is used to connect with patients for Virtual Visits (Telemedicine).  Patients are able to view lab/test results, encounter notes, upcoming appointments, etc.  Non-urgent messages can be sent to your provider as well.   To learn more about what you can do with MyChart, go to [email or web address].    Your next appointment:   1 year(s)  Provider:   Buford Dresser, MD    Other Instructions None

## 2023-05-23 ENCOUNTER — Ambulatory Visit (HOSPITAL_BASED_OUTPATIENT_CLINIC_OR_DEPARTMENT_OTHER): Payer: BC Managed Care – PPO | Admitting: Pulmonary Disease

## 2023-05-23 ENCOUNTER — Encounter (HOSPITAL_BASED_OUTPATIENT_CLINIC_OR_DEPARTMENT_OTHER): Payer: Self-pay | Admitting: Pulmonary Disease

## 2023-05-23 ENCOUNTER — Ambulatory Visit (HOSPITAL_BASED_OUTPATIENT_CLINIC_OR_DEPARTMENT_OTHER): Payer: BC Managed Care – PPO

## 2023-05-23 VITALS — BP 124/74 | HR 78 | Ht 65.0 in | Wt 125.0 lb

## 2023-05-23 DIAGNOSIS — R5382 Chronic fatigue, unspecified: Secondary | ICD-10-CM | POA: Diagnosis not present

## 2023-05-23 DIAGNOSIS — R0602 Shortness of breath: Secondary | ICD-10-CM

## 2023-05-23 NOTE — Patient Instructions (Signed)
We will do blood work & CXR to investigate

## 2023-05-23 NOTE — Assessment & Plan Note (Signed)
She complains of longstanding fatigue rather than dyspnea.  Chest x-ray is clear.  She is a minimal smoker and I did not expect to have any kind of obstructive lung disease.  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 I offered her basic blood work testing including CBC, CMET checking thyroid and adrenal function and B12 and iron levels.  If these are normal, we can take a wait and watch approach versus pursue more testing with PFTs.  But I doubt that there is a pulmonary cause to her problems

## 2023-05-23 NOTE — Progress Notes (Signed)
Subjective:    Patient ID: Mariah [name], female    DOB: 14-Nov-1960, 62 y.o.   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  She reports low energy levels for the past 2 years.  She is not short of breath but reports decrease in her endurance and stamina.  For instance she cannot play consecutive days of pickleball anymore and even when she plays she has to rest in between games.  When she does exercise she feels exhausted afterwards. She had 2-3 respiratory infections for the past 2 years.  She feels that the viral infection "takes her down" and it takes her 4 to 6 weeks to recover.  She feels she may have had a COVID infection in January 2021 although she never tested, her son tested positive. She reports insomnia for the last 10 years since menopause if she gets 4 to 6 hours of sleep, she considers that a good night.  She has been prescribed lorazepam as needed. Bedtime is around 10 PM, sleep latency is minimal but she reports 1-3 nocturnal awakenings and a prolonged post void sleep latency and sometimes is not able to fall asleep again.  She wakes up around 8:30 AM feeling tired. Epworth sleepiness score is 18 and she reports sleepiness while sitting and reading, watching TV, as a passenger in a car or lying down to rest in the afternoon. I feel she is reporting fatigue rather than sleepiness  She underwent cardiac evaluation for palpitations and treadmill stress test may be planned in the future  There is no history suggestive of cataplexy, sleep paralysis or parasomnias  Chest x-ray today appears clear without infiltrates or effusions  Significant tests/ events reviewed  NPSG 11/2019 no OSA, no PLM's  Past Medical History:  Diagnosis Date   Anxiety    Osteopenia    Vision abnormalities    Past Surgical History:  Procedure Laterality Date    DILATION AND CURETTAGE OF UTERUS      No Known Allergies  Social History   Socioeconomic History   Marital status: Married    Spouse name: Not on file   Number of children: Not on file   Years of education: Not on file   Highest education level: Not on file  Occupational History   Not on file  Tobacco Use   Smoking status: Former    Current packs/day: 0.00    Types: Cigarettes    Quit date: 10/02/1983    Years since quitting: 39.6   Smokeless tobacco: Never  Vaping Use   Vaping status: Never Used  Substance and Sexual Activity   Alcohol use: No   Drug use: No   Sexual activity: Yes    Partners: Male    Comment: Vasectomy  Other Topics Concern   Not on file  Social History Narrative   Not on file   Social Determinants of Health   Financial Resource Strain: Not on file  Food Insecurity: Not on file  Transportation Needs: Not on file  Physical Activity: Not on file  Stress: Not on file  Social Connections: Not on file  Intimate Partner Violence: Not on file    Family History  Problem Relation Age of Onset   Hypertension Mother    Skin cancer Mother    Melanoma Father    Multiple myeloma Father    Bipolar disorder Sister  Heart disease Maternal Grandmother    Heart disease Paternal Grandmother    Anxiety disorder Daughter    Depression Son      Review of Systems Constitutional: negative for anorexia, fevers and sweats  Eyes: negative for irritation, redness and visual disturbance  Ears, nose, mouth, throat, and face: negative for earaches, epistaxis, nasal congestion and sore throat  Respiratory: negative for cough, sputum and wheezing  Cardiovascular: negative for chest pain,  lower extremity edema, orthopnea, palpitations and syncope  Gastrointestinal: negative for abdominal pain, constipation, diarrhea, melena, nausea and vomiting  Genitourinary:negative for dysuria, frequency and hematuria  Hematologic/lymphatic: negative for bleeding, easy bruising  and lymphadenopathy  Musculoskeletal:negative for arthralgias, muscle weakness and stiff joints  Neurological: negative for coordination problems, gait problems, headaches and weakness  Endocrine: negative for diabetic symptoms including polydipsia, polyuria and weight loss     Objective:   Physical Exam  Gen. Pleasant, well-nourished, in no distress, normal affect ENT - no pallor,icterus, no post nasal drip Neck: No JVD, no thyromegaly, no carotid bruits Lungs: no use of accessory muscles, no dullness to percussion, clear without rales or rhonchi  Cardiovascular: Rhythm regular, heart sounds  normal, no murmurs or gallops, no peripheral edema Abdomen: soft and non-tender, no hepatosplenomegaly, BS normal. Musculoskeletal: No deformities, no cyanosis or clubbing Neuro:  alert, non focal       Assessment & Plan:

## 2023-05-24 LAB — CBC WITH DIFFERENTIAL/PLATELET
Basophils Absolute: 0.1 10*3/uL (ref 0.0–0.2)
Basos: 1 %
EOS (ABSOLUTE): 0.1 10*3/uL (ref 0.0–0.4)
Eos: 2 %
Hematocrit: 38.1 % (ref 34.0–46.6)
Hemoglobin: 13.1 g/dL (ref 11.1–15.9)
Immature Grans (Abs): 0 10*3/uL (ref 0.0–0.1)
Immature Granulocytes: 0 %
Lymphocytes Absolute: 1.6 10*3/uL (ref 0.7–3.1)
Lymphs: 27 %
MCH: 30.2 pg (ref 26.6–33.0)
MCHC: 34.4 g/dL (ref 31.5–35.7)
MCV: 88 fL (ref 79–97)
Monocytes Absolute: 0.5 10*3/uL (ref 0.1–0.9)
Monocytes: 8 %
Neutrophils Absolute: 3.7 10*3/uL (ref 1.4–7.0)
Neutrophils: 62 %
Platelets: 215 10*3/uL (ref 150–450)
RBC: 4.34 x10E6/uL (ref 3.77–5.28)
RDW: 12.9 % (ref 11.7–15.4)
WBC: 5.9 10*3/uL (ref 3.4–10.8)

## 2023-05-24 LAB — COMPREHENSIVE METABOLIC PANEL
ALT: 17 IU/L (ref 0–32)
AST: 22 IU/L (ref 0–40)
Albumin: 4.7 g/dL (ref 3.9–4.9)
Alkaline Phosphatase: 94 IU/L (ref 44–121)
BUN/Creatinine Ratio: 30 — ABNORMAL HIGH (ref 12–28)
BUN: 22 mg/dL (ref 8–27)
Bilirubin Total: 0.5 mg/dL (ref 0.0–1.2)
CO2: 26 mmol/L (ref 20–29)
Calcium: 9.6 mg/dL (ref 8.7–10.3)
Chloride: 102 mmol/L (ref 96–106)
Creatinine, Ser: 0.73 mg/dL (ref 0.57–1.00)
Globulin, Total: 2.8 g/dL (ref 1.5–4.5)
Glucose: 85 mg/dL (ref 70–99)
Potassium: 4.6 mmol/L (ref 3.5–5.2)
Sodium: 141 mmol/L (ref 134–144)
Total Protein: 7.5 g/dL (ref 6.0–8.5)
eGFR: 92 mL/min/{1.73_m2} (ref >59)

## 2023-05-24 LAB — VITAMIN B12: Vitamin B-12: 546 pg/mL (ref 232–1245)

## 2023-05-24 LAB — IRON AND TIBC
Iron Saturation: 29 % (ref 15–55)
Iron: 100 ug/dL (ref 27–139)
Total Iron Binding Capacity: 340 ug/dL (ref 250–450)
UIBC: 240 ug/dL (ref 118–369)

## 2023-05-24 LAB — CORTISOL: Cortisol: 7.7 ug/dL (ref 6.2–19.4)

## 2023-05-31 ENCOUNTER — Ambulatory Visit
Admission: RE | Admit: 2023-05-31 | Discharge: 2023-05-31 | Disposition: A | Discharge status: Home / Self Care | Payer: BC Managed Care – PPO | Source: Ambulatory Visit | Attending: Internal Medicine | Admitting: Internal Medicine

## 2023-05-31 DIAGNOSIS — Z1231 Encounter for screening mammogram for malignant neoplasm of breast: Secondary | ICD-10-CM

## 2023-08-14 ENCOUNTER — Encounter: Payer: Self-pay | Admitting: Psychiatry

## 2024-03-06 ENCOUNTER — Other Ambulatory Visit: Payer: Self-pay

## 2024-03-06 ENCOUNTER — Telehealth: Payer: Self-pay | Admitting: Psychiatry

## 2024-03-06 DIAGNOSIS — F411 Generalized anxiety disorder: Secondary | ICD-10-CM

## 2024-03-06 DIAGNOSIS — Z961 Presence of intraocular lens: Secondary | ICD-10-CM | POA: Diagnosis not present

## 2024-03-06 DIAGNOSIS — F5101 Primary insomnia: Secondary | ICD-10-CM

## 2024-03-06 DIAGNOSIS — F422 Mixed obsessional thoughts and acts: Secondary | ICD-10-CM

## 2024-03-06 NOTE — Telephone Encounter (Signed)
 Patient has not been seen in a year. Pended a RF for Dr. Toi Foster to review.

## 2024-03-06 NOTE — Telephone Encounter (Signed)
 Pt called yesterday .She didn't know that Camilo Cella C had left and didn't get seen recently. She has been taking care of mother and distracted and stressed. Wants to try to get RF of Ativan  in the meantime.She did make an appt July 9th with Jessica at [facility]. Can we please call her to confirm if this is possible.  Pharmacy- [facility]

## 2024-03-06 NOTE — Telephone Encounter (Signed)
 She has not been seen in over a year and provider has left.

## 2024-03-09 NOTE — Telephone Encounter (Signed)
 Called patient to tell her that her RF for lorazepam  was denied because it has been over a year since she has been seen. She is trying to get an appt with Camilo Cella. She has a PCP and I suggested she call to see if she could get a short supply until she can be seen with Camilo Cella.

## 2024-03-11 ENCOUNTER — Encounter (HOSPITAL_BASED_OUTPATIENT_CLINIC_OR_DEPARTMENT_OTHER): Payer: Self-pay | Admitting: Obstetrics & Gynecology

## 2024-03-11 ENCOUNTER — Other Ambulatory Visit (HOSPITAL_BASED_OUTPATIENT_CLINIC_OR_DEPARTMENT_OTHER): Payer: Self-pay | Admitting: Obstetrics & Gynecology

## 2024-03-11 DIAGNOSIS — F422 Mixed obsessional thoughts and acts: Secondary | ICD-10-CM

## 2024-03-11 DIAGNOSIS — D225 Melanocytic nevi of trunk: Secondary | ICD-10-CM | POA: Diagnosis not present

## 2024-03-11 DIAGNOSIS — F411 Generalized anxiety disorder: Secondary | ICD-10-CM

## 2024-03-11 DIAGNOSIS — Z86018 Personal history of other benign neoplasm: Secondary | ICD-10-CM | POA: Diagnosis not present

## 2024-03-11 DIAGNOSIS — D2272 Melanocytic nevi of left lower limb, including hip: Secondary | ICD-10-CM | POA: Diagnosis not present

## 2024-03-11 DIAGNOSIS — L570 Actinic keratosis: Secondary | ICD-10-CM | POA: Diagnosis not present

## 2024-03-11 DIAGNOSIS — L821 Other seborrheic keratosis: Secondary | ICD-10-CM | POA: Diagnosis not present

## 2024-03-11 DIAGNOSIS — L814 Other melanin hyperpigmentation: Secondary | ICD-10-CM | POA: Diagnosis not present

## 2024-03-11 DIAGNOSIS — F5101 Primary insomnia: Secondary | ICD-10-CM

## 2024-03-11 DIAGNOSIS — L578 Other skin changes due to chronic exposure to nonionizing radiation: Secondary | ICD-10-CM | POA: Diagnosis not present

## 2024-03-11 MED ORDER — LORAZEPAM 0.5 MG PO TABS: ORAL_TABLET | ORAL | 0 refills | Status: DC

## 2024-03-12 ENCOUNTER — Other Ambulatory Visit (HOSPITAL_BASED_OUTPATIENT_CLINIC_OR_DEPARTMENT_OTHER): Payer: Self-pay | Admitting: Obstetrics & Gynecology

## 2024-03-12 DIAGNOSIS — F5101 Primary insomnia: Secondary | ICD-10-CM

## 2024-03-12 DIAGNOSIS — F422 Mixed obsessional thoughts and acts: Secondary | ICD-10-CM

## 2024-03-12 DIAGNOSIS — F411 Generalized anxiety disorder: Secondary | ICD-10-CM

## 2024-03-12 MED ORDER — LORAZEPAM 0.5 MG PO TABS: ORAL_TABLET | ORAL | 0 refills | Status: AC

## 2024-04-08 DIAGNOSIS — F410 Panic disorder [episodic paroxysmal anxiety] without agoraphobia: Secondary | ICD-10-CM | POA: Diagnosis not present

## 2024-04-08 DIAGNOSIS — F411 Generalized anxiety disorder: Secondary | ICD-10-CM | POA: Diagnosis not present

## 2024-04-08 DIAGNOSIS — G4700 Insomnia, unspecified: Secondary | ICD-10-CM | POA: Diagnosis not present

## 2024-04-16 ENCOUNTER — Ambulatory Visit (INDEPENDENT_AMBULATORY_CARE_PROVIDER_SITE_OTHER): Payer: BC Managed Care – PPO | Admitting: Obstetrics & Gynecology

## 2024-04-16 ENCOUNTER — Encounter (HOSPITAL_BASED_OUTPATIENT_CLINIC_OR_DEPARTMENT_OTHER): Payer: Self-pay | Admitting: Obstetrics & Gynecology

## 2024-04-16 VITALS — BP 141/75 | HR 78 | Wt 128.6 lb

## 2024-04-16 DIAGNOSIS — Z780 Asymptomatic menopausal state: Secondary | ICD-10-CM

## 2024-04-16 DIAGNOSIS — M818 Other osteoporosis without current pathological fracture: Secondary | ICD-10-CM | POA: Diagnosis not present

## 2024-04-16 DIAGNOSIS — Z01419 Encounter for gynecological examination (general) (routine) without abnormal findings: Secondary | ICD-10-CM | POA: Diagnosis not present

## 2024-04-16 NOTE — Progress Notes (Signed)
 Breast and Pelvic Exam Patient name: Mariah [name] MRN [id number]  Date of birth: November 08, 1960 Chief Complaint:   Breast and Pelvic Exam  History of Present Illness:   Mariah [name] is a 63 y.o. G42P2002 Caucasian female being seen today for breast and pelvic exam.  [place]  [place] went to [place]  in June.  They had beautiful weather.    Denies vaginal bleeding.    Patient's last menstrual period was 07/14/2011.   Last pap 03/18/2023. Results were: negative.  Last mammogram: 05/31/2023. Results were: normal. Family h/o breast cancer: no Last colonoscopy: 03/07/2021. Results were: normal. Family h/o colorectal cancer: no Dexa:  03/28/2023.  -2.8.  Declines treatment.     03/20/2023   10:08 AM 03/18/2023    1:23 PM 02/19/2022   10:13 AM  Depression screen PHQ 2/9  Decreased Interest 0 0 0  Down, Depressed, Hopeless 0 0 0  PHQ - 2 Score 0 0 0         No data to display           Review of Systems:   Pertinent items are noted in HPI Denies any headaches, blurred vision, fatigue, shortness of breath, chest pain, abdominal pain, abnormal vaginal discharge/itching/odor/irritation, problems with periods, bowel movements, urination, or intercourse unless otherwise stated above. Pertinent History Reviewed:  Reviewed past medical,surgical, social and family history.  Reviewed problem list, medications and allergies. Physical Assessment:   Vitals:   04/16/24 1314  BP: (!) 141/75  Pulse: 78  SpO2: 98%  Weight: 128 lb 9.6 oz (58.3 kg)  Body mass index is 21.4 kg/m.        Physical Examination:   General appearance - well appearing, and in no distress  Mental status - alert, oriented to person, place, and time  Psych:  She has a normal mood and affect  Skin - warm and dry, normal color, no suspicious lesions noted  Chest - effort normal, all lung fields clear to auscultation bilaterally  Heart - normal rate and regular rhythm  Neck:  midline trachea, no thyromegaly  or nodules  Breasts - breasts appear normal, no suspicious masses, no skin or nipple changes or  axillary nodes  Abdomen - soft, nontender, nondistended, no masses or organomegaly  Pelvic - VULVA: normal appearing vulva with no masses, tenderness or lesions   VAGINA: atrophic changes, no lesions   CERVIX: normal appearing cervix without discharge or lesions, no CMT  Thin prep pap is not done  UTERUS: uterus is felt to be normal size, shape, consistency and nontender   ADNEXA: No adnexal masses or tenderness noted.  Rectal - normal rectal, good sphincter tone, no masses felt.  Extremities:  No swelling or varicosities noted  Chaperone present for exam  No results found for this or any previous visit (from the past 24 hours).  Assessment & Plan:  1. Encntr for gyn exam (general) (routine) w/o abn findings (Primary) - Pap smear negative 2024 - Mammogram 05/2023 - Colonoscopy 2022.  Follow up 10 years. - Bone mineral density 2024 - lab work done with PCP, Dr. Andrew - vaccines reviewed/updated.  Prevnar, Tdap, and RSV all discussed.  Declines Shingrix at this time.  2. 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  Recommended PTH last year but she didn't come back for it.  Willing to do today - PTH, Intact and Calcium  3. Postmenopausal   Meds: No orders of the defined types were  placed in this encounter.   Follow-up: Return for 1-2 years or with any new issue/concern.  Ronal GORMAN Pinal, MD 04/16/2024 1:56 PM

## 2024-04-17 LAB — PTH, INTACT AND CALCIUM
Calcium: 9.5 mg/dL (ref 8.7–10.3)
PTH: 30 pg/mL (ref 15–65)

## 2024-04-22 ENCOUNTER — Other Ambulatory Visit: Payer: Self-pay | Admitting: Internal Medicine

## 2024-04-22 DIAGNOSIS — Z1231 Encounter for screening mammogram for malignant neoplasm of breast: Secondary | ICD-10-CM

## 2024-04-28 DIAGNOSIS — G4700 Insomnia, unspecified: Secondary | ICD-10-CM | POA: Diagnosis not present

## 2024-04-28 DIAGNOSIS — F411 Generalized anxiety disorder: Secondary | ICD-10-CM | POA: Diagnosis not present

## 2024-04-28 DIAGNOSIS — F410 Panic disorder [episodic paroxysmal anxiety] without agoraphobia: Secondary | ICD-10-CM | POA: Diagnosis not present

## 2024-05-08 ENCOUNTER — Ambulatory Visit (HOSPITAL_BASED_OUTPATIENT_CLINIC_OR_DEPARTMENT_OTHER): Payer: Self-pay | Admitting: Obstetrics & Gynecology

## 2024-05-12 ENCOUNTER — Ambulatory Visit (INDEPENDENT_AMBULATORY_CARE_PROVIDER_SITE_OTHER): Payer: BC Managed Care – PPO | Admitting: Otolaryngology

## 2024-05-13 DIAGNOSIS — F4322 Adjustment disorder with anxiety: Secondary | ICD-10-CM | POA: Diagnosis not present

## 2024-05-29 DIAGNOSIS — G4700 Insomnia, unspecified: Secondary | ICD-10-CM | POA: Diagnosis not present

## 2024-05-29 DIAGNOSIS — F410 Panic disorder [episodic paroxysmal anxiety] without agoraphobia: Secondary | ICD-10-CM | POA: Diagnosis not present

## 2024-05-29 DIAGNOSIS — F411 Generalized anxiety disorder: Secondary | ICD-10-CM | POA: Diagnosis not present

## 2024-06-02 ENCOUNTER — Ambulatory Visit

## 2024-06-29 ENCOUNTER — Ambulatory Visit
Admission: RE | Admit: 2024-06-29 | Discharge: 2024-06-29 | Disposition: A | Source: Ambulatory Visit | Attending: Internal Medicine | Admitting: Internal Medicine

## 2024-06-29 DIAGNOSIS — Z1231 Encounter for screening mammogram for malignant neoplasm of breast: Secondary | ICD-10-CM | POA: Diagnosis not present

## 2024-07-14 DIAGNOSIS — F4322 Adjustment disorder with anxiety: Secondary | ICD-10-CM | POA: Diagnosis not present

## 2024-08-04 DIAGNOSIS — G4700 Insomnia, unspecified: Secondary | ICD-10-CM | POA: Diagnosis not present

## 2024-08-04 DIAGNOSIS — F4322 Adjustment disorder with anxiety: Secondary | ICD-10-CM | POA: Diagnosis not present

## 2024-08-04 DIAGNOSIS — F410 Panic disorder [episodic paroxysmal anxiety] without agoraphobia: Secondary | ICD-10-CM | POA: Diagnosis not present

## 2024-08-04 DIAGNOSIS — F411 Generalized anxiety disorder: Secondary | ICD-10-CM | POA: Diagnosis not present

## 2024-08-12 ENCOUNTER — Other Ambulatory Visit (HOSPITAL_BASED_OUTPATIENT_CLINIC_OR_DEPARTMENT_OTHER): Payer: Self-pay | Admitting: Obstetrics & Gynecology

## 2024-08-24 DIAGNOSIS — F410 Panic disorder [episodic paroxysmal anxiety] without agoraphobia: Secondary | ICD-10-CM | POA: Diagnosis not present

## 2024-08-24 DIAGNOSIS — F411 Generalized anxiety disorder: Secondary | ICD-10-CM | POA: Diagnosis not present

## 2024-08-24 DIAGNOSIS — G4700 Insomnia, unspecified: Secondary | ICD-10-CM | POA: Diagnosis not present

## 2024-09-02 ENCOUNTER — Encounter (HOSPITAL_BASED_OUTPATIENT_CLINIC_OR_DEPARTMENT_OTHER): Payer: Self-pay | Admitting: Obstetrics & Gynecology

## 2024-09-16 DIAGNOSIS — F4322 Adjustment disorder with anxiety: Secondary | ICD-10-CM | POA: Diagnosis not present

## 2024-09-28 DIAGNOSIS — F410 Panic disorder [episodic paroxysmal anxiety] without agoraphobia: Secondary | ICD-10-CM | POA: Diagnosis not present

## 2024-09-28 DIAGNOSIS — G4700 Insomnia, unspecified: Secondary | ICD-10-CM | POA: Diagnosis not present

## 2024-09-28 DIAGNOSIS — F411 Generalized anxiety disorder: Secondary | ICD-10-CM | POA: Diagnosis not present

## 2024-10-08 NOTE — Progress Notes (Signed)
 " [facility] [address] [place] [address] Phone: [phone] Subjective:   Mariah Mariah Mariah Mariah am a scribe for Dr. Claudene.   I'm seeing this patient by the request  of:  Mariah Truman GRADE., MD  CC: left knee pain follow up   YEP:Dlagzrupcz  Mariah Mariah is a 64 y.o. female coming in with complaint of L knee pain. Last seen in 2022 for back pain. Patient states that the the left knee is better now. When she did stairs the inside of the knee was hurting. The pain was only present with the stairs not with any other activity. Knees are not hurting on the stairs anymore. Could be due to the tart cherry supplement from the precious recommendation.       Past Medical History:  Diagnosis Date   Anxiety    Osteopenia    Vision abnormalities    Past Surgical History:  Procedure Laterality Date   DILATION AND CURETTAGE OF UTERUS     Social History   Socioeconomic History   Marital status: Married    Spouse name: Not on file   Number of children: Not on file   Years of education: Not on file   Highest education level: Not on file  Occupational History   Not on file  Tobacco Use   Smoking status: Former    Current packs/day: 0.00    Types: Cigarettes    Quit date: 10/02/1983    Years since quitting: 41.0   Smokeless tobacco: Never  Vaping Use   Vaping status: Never Used  Substance and Sexual Activity   Alcohol use: No   Drug use: No   Sexual activity: Yes    Partners: Male    Comment: Vasectomy  Other Topics Concern   Not on file  Social History Narrative   Not on file   Social Drivers of Health   Tobacco Use: Medium Risk (04/16/2024)   Patient History    Smoking Tobacco Use: Former    Smokeless Tobacco Use: Never    Passive Exposure: Not on Actuary Strain: Not on file  Food Insecurity: Not on file  Transportation Needs: Not on file  Physical Activity: Not on file  Stress: Not on file  Social Connections:  Not on file  Depression (PHQ2-9): Low Risk (03/20/2023)   Depression (PHQ2-9)    PHQ-2 Score: 0  Alcohol Screen: Not on file  Housing: Not on file  Utilities: Not on file  Health Literacy: Not on file   Allergies[1] Family History  Problem Relation Age of Onset   Hypertension Mother    Skin cancer Mother    Melanoma Father    Multiple myeloma Father    Bipolar disorder Sister    Anxiety disorder Daughter    Heart disease Maternal Grandmother    Heart disease Paternal Grandmother    Depression Son    Breast cancer Neg Hx     Current Outpatient Medications (Other):    busPIRone (BUSPAR) 15 MG tablet, Take by mouth. Cut tablet up in 3rds, taking 5 mg at a time   Calcium Carb-Cholecalciferol (CALCIUM 600/VITAMIN D PO), Take by mouth.   LORazepam  (ATIVAN ) 0.5 MG tablet, Take 1/2 to 1 tablet every 8 hours as needed for anxiety/panic.   MAGNESIUM GLYCINATE PO, Take by mouth. (Patient not taking: Reported on 04/16/2024)   THEANINE PO, Take by mouth. (Patient not taking: Reported on 04/16/2024)   VITAMIN D-VITAMIN K PO, Take by  mouth.   Reviewed prior external information including notes and imaging from  primary care provider As well as notes that were available from care everywhere and other healthcare systems.  Past medical history, social, surgical and family history all reviewed in electronic medical record.  No pertanent information unless stated regarding to the chief complaint.   Review of Systems:  No headache, visual changes, nausea, vomiting, diarrhea, constipation, dizziness, abdominal pain, skin rash, fevers, chills, night sweats, weight loss, swollen lymph nodes, body aches, joint swelling, chest pain, shortness of breath, mood changes. POSITIVE muscle aches  Objective  Blood pressure (!) 140/70, pulse 81, height 5' 5 (1.651 m), weight 128 lb (58.1 kg), last menstrual period 07/14/2011, SpO2 97%.   General: No apparent distress alert and oriented x3 mood and affect  normal, dressed appropriately.  HEENT: Pupils equal, extraocular movements intact  Respiratory: Patient's speak in full sentences and does not appear short of breath  Cardiovascular: No lower extremity edema, non tender, no erythema  Left knee exam shows patient does have good range of motion noted.  Some mild tenderness noted over the medial aspect of the knee, no instability noted.  Limited muscular skeletal ultrasound was performed and interpreted by Mariah Mariah, M  Limited ultrasound shows some mild hypoechoic changes and mild narrowing of the patellofemoral joint but nothing severe.    Impression and Recommendations:     The above documentation has been reviewed and is accurate and complete Mariah Mariah M Mariah Zuluaga, DO       [1] No Known Allergies  "

## 2024-10-09 ENCOUNTER — Encounter: Payer: Self-pay | Admitting: Family Medicine

## 2024-10-09 ENCOUNTER — Ambulatory Visit: Admitting: Family Medicine

## 2024-10-09 ENCOUNTER — Other Ambulatory Visit: Payer: Self-pay

## 2024-10-09 VITALS — BP 140/70 | HR 81 | Ht 65.0 in | Wt 128.0 lb

## 2024-10-09 DIAGNOSIS — M222X2 Patellofemoral disorders, left knee: Secondary | ICD-10-CM | POA: Insufficient documentation

## 2024-10-09 DIAGNOSIS — M25562 Pain in left knee: Secondary | ICD-10-CM | POA: Diagnosis not present

## 2024-10-09 NOTE — Patient Instructions (Signed)
 Tru pull lite  VMO exercises Ice after activity See me in 3 months if you need me

## 2024-10-09 NOTE — Assessment & Plan Note (Signed)
 Patient is overall doing well, and is doing the [place] Year's resolution to be more active and spend more time for herself.  Is going to be playing pickle ball.  We discussed with patient about Tru pull lite brace, VMO strengthening, worsening pain can consider injection but think it would be highly unneeded.  Follow-up again in 6 to 12 weeks.

## 2024-12-21 ENCOUNTER — Ambulatory Visit (INDEPENDENT_AMBULATORY_CARE_PROVIDER_SITE_OTHER): Admitting: Otolaryngology

## 2024-12-21 ENCOUNTER — Ambulatory Visit (INDEPENDENT_AMBULATORY_CARE_PROVIDER_SITE_OTHER): Admitting: Audiology

## 2025-01-07 ENCOUNTER — Ambulatory Visit: Admitting: Family Medicine

## 2026-04-20 ENCOUNTER — Ambulatory Visit (HOSPITAL_BASED_OUTPATIENT_CLINIC_OR_DEPARTMENT_OTHER): Admitting: Obstetrics & Gynecology
